# Patient Record
Sex: Male | Born: 1935 | Race: White | Hispanic: No | Smoking: Never smoker
Health system: Southern US, Community
[De-identification: ages and names within clinical notes are randomized; demographics above are authoritative.]

## PROBLEM LIST (undated history)

## (undated) DIAGNOSIS — F0390 Unspecified dementia without behavioral disturbance: Secondary | ICD-10-CM

## (undated) DIAGNOSIS — I4891 Unspecified atrial fibrillation: Secondary | ICD-10-CM

## (undated) DIAGNOSIS — I10 Essential (primary) hypertension: Secondary | ICD-10-CM

## (undated) DIAGNOSIS — K219 Gastro-esophageal reflux disease without esophagitis: Secondary | ICD-10-CM

## (undated) DIAGNOSIS — N189 Chronic kidney disease, unspecified: Secondary | ICD-10-CM

## (undated) DIAGNOSIS — E039 Hypothyroidism, unspecified: Secondary | ICD-10-CM

## (undated) DIAGNOSIS — M1990 Unspecified osteoarthritis, unspecified site: Secondary | ICD-10-CM

---

## 2015-03-07 ENCOUNTER — Inpatient Hospital Stay (HOSPITAL_COMMUNITY)
Admission: EM | Admit: 2015-03-07 | Discharge: 2015-03-13 | DRG: 177 | Disposition: A | Discharge status: Hospice | Payer: Medicare PPO | Attending: Internal Medicine | Admitting: Internal Medicine

## 2015-03-07 ENCOUNTER — Emergency Department (HOSPITAL_COMMUNITY): Payer: Medicare PPO

## 2015-03-07 ENCOUNTER — Encounter (HOSPITAL_COMMUNITY): Payer: Self-pay | Admitting: *Deleted

## 2015-03-07 DIAGNOSIS — N390 Urinary tract infection, site not specified: Secondary | ICD-10-CM | POA: Diagnosis present

## 2015-03-07 DIAGNOSIS — Z7401 Bed confinement status: Secondary | ICD-10-CM

## 2015-03-07 DIAGNOSIS — R112 Nausea with vomiting, unspecified: Secondary | ICD-10-CM | POA: Diagnosis present

## 2015-03-07 DIAGNOSIS — Z66 Do not resuscitate: Secondary | ICD-10-CM | POA: Diagnosis present

## 2015-03-07 DIAGNOSIS — G9340 Encephalopathy, unspecified: Secondary | ICD-10-CM | POA: Diagnosis present

## 2015-03-07 DIAGNOSIS — J9601 Acute respiratory failure with hypoxia: Secondary | ICD-10-CM | POA: Diagnosis present

## 2015-03-07 DIAGNOSIS — E43 Unspecified severe protein-calorie malnutrition: Secondary | ICD-10-CM | POA: Diagnosis present

## 2015-03-07 DIAGNOSIS — Z7982 Long term (current) use of aspirin: Secondary | ICD-10-CM

## 2015-03-07 DIAGNOSIS — R1314 Dysphagia, pharyngoesophageal phase: Secondary | ICD-10-CM | POA: Diagnosis present

## 2015-03-07 DIAGNOSIS — N183 Chronic kidney disease, stage 3 (moderate): Secondary | ICD-10-CM | POA: Diagnosis present

## 2015-03-07 DIAGNOSIS — E860 Dehydration: Secondary | ICD-10-CM | POA: Diagnosis present

## 2015-03-07 DIAGNOSIS — R627 Adult failure to thrive: Secondary | ICD-10-CM | POA: Diagnosis present

## 2015-03-07 DIAGNOSIS — I482 Chronic atrial fibrillation, unspecified: Secondary | ICD-10-CM | POA: Diagnosis present

## 2015-03-07 DIAGNOSIS — J690 Pneumonitis due to inhalation of food and vomit: Principal | ICD-10-CM | POA: Diagnosis present

## 2015-03-07 DIAGNOSIS — K219 Gastro-esophageal reflux disease without esophagitis: Secondary | ICD-10-CM | POA: Diagnosis present

## 2015-03-07 DIAGNOSIS — R1310 Dysphagia, unspecified: Secondary | ICD-10-CM | POA: Diagnosis present

## 2015-03-07 DIAGNOSIS — Z79891 Long term (current) use of opiate analgesic: Secondary | ICD-10-CM

## 2015-03-07 DIAGNOSIS — R52 Pain, unspecified: Secondary | ICD-10-CM

## 2015-03-07 DIAGNOSIS — R630 Anorexia: Secondary | ICD-10-CM | POA: Insufficient documentation

## 2015-03-07 DIAGNOSIS — E785 Hyperlipidemia, unspecified: Secondary | ICD-10-CM | POA: Diagnosis present

## 2015-03-07 DIAGNOSIS — R532 Functional quadriplegia: Secondary | ICD-10-CM | POA: Diagnosis present

## 2015-03-07 DIAGNOSIS — E039 Hypothyroidism, unspecified: Secondary | ICD-10-CM | POA: Diagnosis present

## 2015-03-07 DIAGNOSIS — I5032 Chronic diastolic (congestive) heart failure: Secondary | ICD-10-CM | POA: Diagnosis present

## 2015-03-07 DIAGNOSIS — R531 Weakness: Secondary | ICD-10-CM | POA: Insufficient documentation

## 2015-03-07 DIAGNOSIS — Z515 Encounter for palliative care: Secondary | ICD-10-CM

## 2015-03-07 DIAGNOSIS — M1990 Unspecified osteoarthritis, unspecified site: Secondary | ICD-10-CM | POA: Diagnosis present

## 2015-03-07 DIAGNOSIS — E870 Hyperosmolality and hypernatremia: Secondary | ICD-10-CM | POA: Diagnosis present

## 2015-03-07 DIAGNOSIS — I129 Hypertensive chronic kidney disease with stage 1 through stage 4 chronic kidney disease, or unspecified chronic kidney disease: Secondary | ICD-10-CM | POA: Diagnosis present

## 2015-03-07 DIAGNOSIS — N189 Chronic kidney disease, unspecified: Secondary | ICD-10-CM

## 2015-03-07 DIAGNOSIS — R1111 Vomiting without nausea: Secondary | ICD-10-CM

## 2015-03-07 DIAGNOSIS — E876 Hypokalemia: Secondary | ICD-10-CM | POA: Diagnosis present

## 2015-03-07 DIAGNOSIS — F0390 Unspecified dementia without behavioral disturbance: Secondary | ICD-10-CM | POA: Diagnosis present

## 2015-03-07 DIAGNOSIS — R1110 Vomiting, unspecified: Secondary | ICD-10-CM

## 2015-03-07 DIAGNOSIS — N179 Acute kidney failure, unspecified: Secondary | ICD-10-CM | POA: Diagnosis present

## 2015-03-07 DIAGNOSIS — Z79899 Other long term (current) drug therapy: Secondary | ICD-10-CM

## 2015-03-07 HISTORY — DX: Gastro-esophageal reflux disease without esophagitis: K21.9

## 2015-03-07 HISTORY — DX: Chronic kidney disease, unspecified: N18.9

## 2015-03-07 HISTORY — DX: Hypothyroidism, unspecified: E03.9

## 2015-03-07 HISTORY — DX: Essential (primary) hypertension: I10

## 2015-03-07 HISTORY — DX: Unspecified osteoarthritis, unspecified site: M19.90

## 2015-03-07 HISTORY — DX: Unspecified dementia, unspecified severity, without behavioral disturbance, psychotic disturbance, mood disturbance, and anxiety: F03.90

## 2015-03-07 HISTORY — DX: Unspecified atrial fibrillation: I48.91

## 2015-03-07 LAB — CBC WITH DIFFERENTIAL/PLATELET
BASOS PCT: 0 % (ref 0–1)
Basophils Absolute: 0 10*3/uL (ref 0.0–0.1)
EOS ABS: 0.2 10*3/uL (ref 0.0–0.7)
Eosinophils Relative: 2 % (ref 0–5)
HCT: 43.1 % (ref 39.0–52.0)
HEMOGLOBIN: 14.5 g/dL (ref 13.0–17.0)
Lymphocytes Relative: 19 % (ref 12–46)
Lymphs Abs: 1.9 10*3/uL (ref 0.7–4.0)
MCH: 29.4 pg (ref 26.0–34.0)
MCHC: 33.6 g/dL (ref 30.0–36.0)
MCV: 87.2 fL (ref 78.0–100.0)
MONOS PCT: 10 % (ref 3–12)
Monocytes Absolute: 0.9 10*3/uL (ref 0.1–1.0)
NEUTROS PCT: 69 % (ref 43–77)
Neutro Abs: 6.7 10*3/uL (ref 1.7–7.7)
PLATELETS: 265 10*3/uL (ref 150–400)
RBC: 4.94 MIL/uL (ref 4.22–5.81)
RDW: 14.8 % (ref 11.5–15.5)
WBC: 9.7 10*3/uL (ref 4.0–10.5)

## 2015-03-07 LAB — COMPREHENSIVE METABOLIC PANEL
ALBUMIN: 3.1 g/dL — AB (ref 3.5–5.2)
ALT: 9 U/L (ref 0–53)
ANION GAP: 15 (ref 5–15)
AST: 13 U/L (ref 0–37)
Alkaline Phosphatase: 103 U/L (ref 39–117)
BUN: 72 mg/dL — ABNORMAL HIGH (ref 6–23)
CO2: 21 mmol/L (ref 19–32)
CREATININE: 3.55 mg/dL — AB (ref 0.50–1.35)
Calcium: 8.7 mg/dL (ref 8.4–10.5)
Chloride: 109 mmol/L (ref 96–112)
GFR calc Af Amer: 16 mL/min — ABNORMAL LOW (ref >90)
GFR calc non Af Amer: 14 mL/min — ABNORMAL LOW (ref >90)
Glucose, Bld: 88 mg/dL (ref 70–99)
Potassium: 3.4 mmol/L — ABNORMAL LOW (ref 3.5–5.1)
Sodium: 145 mmol/L (ref 135–145)
TOTAL PROTEIN: 7.2 g/dL (ref 6.0–8.3)
Total Bilirubin: 0.7 mg/dL (ref 0.3–1.2)

## 2015-03-07 LAB — I-STAT CHEM 8, ED
BUN: 57 mg/dL — ABNORMAL HIGH (ref 6–23)
CREATININE: 3.4 mg/dL — AB (ref 0.50–1.35)
Calcium, Ion: 1.14 mmol/L (ref 1.13–1.30)
Chloride: 109 mmol/L (ref 96–112)
GLUCOSE: 88 mg/dL (ref 70–99)
HCT: 46 % (ref 39.0–52.0)
HEMOGLOBIN: 15.6 g/dL (ref 13.0–17.0)
Potassium: 3.3 mmol/L — ABNORMAL LOW (ref 3.5–5.1)
Sodium: 145 mmol/L (ref 135–145)
TCO2: 17 mmol/L (ref 0–100)

## 2015-03-07 LAB — URINALYSIS, ROUTINE W REFLEX MICROSCOPIC
Glucose, UA: NEGATIVE mg/dL
KETONES UR: NEGATIVE mg/dL
NITRITE: NEGATIVE
PH: 5 (ref 5.0–8.0)
PROTEIN: 100 mg/dL — AB
Specific Gravity, Urine: 1.014 (ref 1.005–1.030)
Urobilinogen, UA: 1 mg/dL (ref 0.0–1.0)

## 2015-03-07 LAB — URINE MICROSCOPIC-ADD ON

## 2015-03-07 LAB — LIPASE, BLOOD: LIPASE: 28 U/L (ref 11–59)

## 2015-03-07 MED ORDER — POTASSIUM CHLORIDE 10 MEQ/100ML IV SOLN
10.0000 meq | INTRAVENOUS | Status: DC
Start: 2015-03-08 — End: 2015-03-08
  Administered 2015-03-08: 10 meq via INTRAVENOUS
  Filled 2015-03-07 (×2): qty 100

## 2015-03-07 MED ORDER — ONDANSETRON 8 MG PO TBDP
8.0000 mg | ORAL_TABLET | Freq: Once | ORAL | Status: AC
  Administered 2015-03-07: 8 mg via ORAL
  Filled 2015-03-07: qty 1

## 2015-03-07 NOTE — ED Provider Notes (Addendum)
CSN: [id number]     Arrival date & time 03/07/15  1840 History   First MD Initiated Contact with Patient 03/07/15 2026     Chief Complaint  Patient presents with  . Weakness  . Diarrhea  . Emesis     (Consider location/radiation/quality/duration/timing/severity/associated sxs/prior Treatment) HPI   John Wilkins is a 81 y.o. male who presents for evaluation of decreased oral intake, vomiting, and right-sided abdominal pain present for at least 1 week. The problem is also been present previously. Intermittently, and he was recently admitted to hospital, had evaluation, treatment, for it. He's been home from the hospital for 2 weeks. Patient gets 24/7 caretaking, and intermittent nursing evaluation, at his home. He is in his home alone otherwise. His [place] in [place].  Level V Caveat- Dementia   Past Medical History  Diagnosis Date  . Hypertension   . GERD (gastroesophageal reflux disease)   . Osteoarthritis   . Chronic kidney disease   . Dementia   . Hypothyroidism   . A-fib    No past surgical history on file. No family history on file. History  Substance Use Topics  . Smoking status: Not on file  . Smokeless tobacco: Not on file  . Alcohol Use: Not on file    Review of Systems  Unable to perform ROS     Allergies  Review of patient's allergies indicates no known allergies.  Home Medications   Prior to Admission medications   Medication Sig Start Date End Date Taking? Authorizing Provider  acetaminophen (TYLENOL) 500 MG tablet Take 500 mg by mouth every 6 (six) hours as needed for mild pain.   Yes Historical Provider, MD  aspirin EC 81 MG tablet Take 81 mg by mouth daily.   Yes Historical Provider, MD  diltiazem (DILACOR XR) 180 MG 24 hr capsule Take 180 mg by mouth daily.   Yes Historical Provider, MD  donepezil (ARICEPT) 10 MG tablet Take 10 mg by mouth at bedtime.   Yes Historical Provider, MD  furosemide (LASIX) 20 MG tablet Take 20  mg by mouth 2 (two) times daily.   Yes Historical Provider, MD  levothyroxine (SYNTHROID, LEVOTHROID) 112 MCG tablet Take 112 mcg by mouth daily before breakfast.   Yes Historical Provider, MD  megestrol (MEGACE) 40 MG tablet Take 40 mg by mouth 4 (four) times daily.   Yes Historical Provider, MD  metoprolol tartrate (LOPRESSOR) 25 MG tablet Take 25 mg by mouth 2 (two) times daily.   Yes Historical Provider, MD  pantoprazole (PROTONIX) 40 MG tablet Take 40 mg by mouth daily.   Yes Historical Provider, MD  pravastatin (PRAVACHOL) 20 MG tablet Take 20 mg by mouth daily.   Yes Historical Provider, MD  traMADol (ULTRAM) 50 MG tablet Take 50 mg by mouth every 6 (six) hours as needed for moderate pain.   Yes Historical Provider, MD   BP 155/97 mmHg  Pulse 72  Temp(Src) 98.6 F (37 C) (Rectal)  Resp 16  SpO2 92% Physical Exam  Constitutional: He appears well-developed.  Elderly, frail  HENT:  Head: Normocephalic and atraumatic.  Right Ear: External ear normal.  Left Ear: External ear normal.  Mucous members are moist.  Eyes: Conjunctivae and EOM are normal. Pupils are equal, round, and reactive to light.  Neck: Normal range of motion and phonation normal. Neck supple.  Cardiovascular: Normal rate, regular rhythm and normal heart sounds.   Pulmonary/Chest: Effort normal and breath sounds normal. No respiratory distress. He  exhibits no bony tenderness.  Abdominal: Soft. He exhibits no distension and no mass. There is tenderness (Right-sided, diffuse, mild.). There is no rebound and no guarding.  Abdomen is soft.  Musculoskeletal: Normal range of motion.  Neurological: He is alert. No cranial nerve deficit or sensory deficit. He exhibits normal muscle tone. Coordination normal.  Skin: Skin is warm, dry and intact.  Psychiatric: He has a normal mood and affect. His behavior is normal.  Nursing note and vitals reviewed.   ED Course  Procedures (including critical care time)  Medications   potassium chloride 10 mEq in 100 mL IVPB (not administered)  sodium chloride 0.9 % bolus 2,000 mL (not administered)  ondansetron (ZOFRAN-ODT) disintegrating tablet 8 mg (8 mg Oral Given 03/07/15 2121)    Patient Vitals for the past 24 hrs:  BP Temp Temp src Pulse Resp SpO2  03/07/15 2203 155/97 mmHg - - 72 16 92 %  03/07/15 1857 131/72 mmHg 98.6 F (37 C) Rectal 64 16 95 %    12:00 AM-Consult complete with Hospitalist. Patient case explained and discussed. He agrees to admit patient for further evaluation and treatment. Call ended at 0005    Labs Review Labs Reviewed  COMPREHENSIVE METABOLIC PANEL - Abnormal; Notable for the following:    Potassium 3.4 (*)    BUN 72 (*)    Creatinine, Ser 3.55 (*)    Albumin 3.1 (*)    GFR calc non Af Amer 14 (*)    GFR calc Af Amer 16 (*)    All other components within normal limits  URINALYSIS, ROUTINE W REFLEX MICROSCOPIC - Abnormal; Notable for the following:    Hgb urine dipstick TRACE (*)    Bilirubin Urine SMALL (*)    Protein, ur 100 (*)    Leukocytes, UA TRACE (*)    All other components within normal limits  I-STAT CHEM 8, ED - Abnormal; Notable for the following:    Potassium 3.3 (*)    BUN 57 (*)    Creatinine, Ser 3.40 (*)    All other components within normal limits  URINE CULTURE  LIPASE, BLOOD  CBC WITH DIFFERENTIAL/PLATELET  URINE MICROSCOPIC-ADD ON  BLOOD GAS, ARTERIAL   _________________________________________________________________________________  Below labs, are from recent hospitalization at [facility]- Copied from Care Everywhere  Labs: Results for orders placed or performed during the hospital encounter of 02/17/15 (from the past 24 hour(s))  Basic metabolic panel  Collection Time: 02/19/15 3:52 AM  Result Value Ref Range  Sodium 144 136 - 146 mmol/L  Potassium 3.8 3.7 - 5.4 mmol/L  Chloride 106 97 - 108 mmol/L  Carbon Dioxide (CO2) 23 20 - 32 mmol/L  Anion Gap 15 7 - 16 mmol/L  Glucose 87 65 - 99  mg/dL  BUN 26 10 - 36 mg/dL  Creatinine 1.61 (H) 0.96 - 1.27 mg/dL  Calcium 8.7 8.6 - 04.5 mg/dL  BUN/Creatinine Ratio 40.9 11.0 - 26.0  GFR-African American 40 mL/min/1.7m2  GFR Non-African American 33 mL/min/1.42m2  ZCBC  Collection Time: 02/19/15 3:52 AM  Result Value Ref Range  WBC 6.7 5.1 - 10.8 thou/mcL  RBC 4.29 4.05 - 5.64 million/mcL  Hemoglobin 12.3 (L) 13.5 - 17.5 gm/dL  Hematocrit 81.1 (L) 91.4 - 52.5 %  MCV 88 83 - 97 fL  MCH 28.7 28.0 - 33.0 pg  MCHC 32.5 32.0 - 36.0 gm/dL  Platelet Count 782 956 - 400 thou/mcL  RDW Standard Deviation 44.6 38.5 - 50.5 fL  RDW Coefficient of Variation  14.1 11.7 - 15.2 %  MPV 9.6 8.9 - 11.0 fL  Automated Differential  Collection Time: 02/19/15 3:52 AM  Result Value Ref Range  Neutrophils % 58.0 50.0 - 70.0 %  Lymphocytes, % 25.7 25.0 - 40.0 %  Monocytes % 13.4 (H) 4.0 - 12.0 %  Eosinophils % 2.7 1.0 - 6.0 %  Basophils % 0.2 0.0 - 2.0 %  Neutrophils Absolute 3.9 1.5 - 7.5 thou/mcL  Lymphocytes Absolute 1.7 1.0 - 4.5 thou/mcL  Monocytes Absolute 0.9 (H) 0.1 - 0.8 thou/mcL  Eos (Absolute Value) 0.2 0.0 - 0.5 thou/mcL  Basophils Absolute 0.0 0.0 - 0.2 thou/mcL  _________________________________________________________________________________    Imaging Review Ct Abdomen Pelvis Wo Contrast  03/07/2015   CLINICAL DATA:  Abdominal pain of 6 weeks duration.  EXAM: CT ABDOMEN AND PELVIS WITHOUT CONTRAST  TECHNIQUE: Multidetector CT imaging of the abdomen and pelvis was performed following the standard protocol without IV contrast.  COMPARISON:  None.  FINDINGS: There are unremarkable unenhanced appearances of the liver and bile ducts. There is prior cholecystectomy.  The spleen, pancreas and adrenals appear unremarkable.  Multiple renal cysts are present bilaterally, measuring up to 9 cm on the right and 8 cm on the left. These are not conclusively characterized in the absence of intravenous contrast but are more likely benign. Both kidneys  are negative for calculus or hydronephrosis. Both ureters appear unremarkable. Urinary bladder is decompressed but grossly unremarkable.  The abdominal aorta is normal in caliber with mild atherosclerotic calcification.  There are unremarkable appearances of the stomach, small bowel and colon.  No acute inflammatory changes are evident in the abdomen or pelvis. There is no ascites. There is no adenopathy.  Mild ground-glass opacities are present in the bases of both lungs, indeterminate with regard to infectious infiltrate versus atelectasis versus aspiration. There is a fat containing diaphragmatic hernia on the right posteriorly.  Multiple lower thoracic and lumbar vertebral compressions are present, more likely old. No discrete skeletal lesions are evident.  IMPRESSION: 1. No acute findings are evident in the abdomen or pelvis. 2. Multiple renal cysts bilaterally, indeterminate but more likely benign. 3. Mild airspace opacities in both lung bases, indeterminate with regard to infection versus atelectasis versus aspiration.   Electronically Signed   By: Ellery Plunkaniel R Mitchell M.D.   On: 03/07/2015 23:11   Dg Chest 1 View  03/07/2015   CLINICAL DATA:  Weakness.  Diarrhea.  EXAM: CHEST  1 VIEW  COMPARISON:  None.  FINDINGS: The Chin overlies the apices. High riding humeral heads, consistent with chronic rotator cuff insufficiency. Mild cardiomegaly with a tortuous thoracic aorta. No right and no definite left pleural effusion. No pneumothorax. Mild right base scarring. Patchy left mid and lower lung airspace disease.  IMPRESSION: Cardiomegaly without congestive failure.  Patchy left-sided opacities which are of indeterminate acuity. Cannot exclude infection or aspiration. Consider PA and lateral radiographs if possible.   Electronically Signed   By: Jeronimo GreavesKyle  Talbot M.D.   On: 03/07/2015 21:50     EKG Interpretation None      MDM   Final diagnoses:  Vomiting without nausea, vomiting of unspecified type  AKI  (acute kidney injury)  Hypokalemia  Dementia, without behavioral disturbance    Decreased oral intake, with vomiting, and fluid intolerance, now with acute kidney injury, rising creatinine from baseline 2 weeks ago, with elevated BUN. No acute intra-abdominal pathology. Potassium is somewhat low, not elevated. Patient will require overnight observation for IV fluids, and attempted sacralization before  consideration of discharge. His PCP recently started him on Megace, to improve his appetite.   Nursing Notes Reviewed/ Care Coordinated, and agree without changes. Applicable Imaging Reviewed.  Interpretation of Laboratory Data incorporated into ED treatment   Plan: Admit    Mancel Bale, MD 03/08/15 0006  Mancel Bale, MD 03/08/15 4540

## 2015-03-07 NOTE — ED Notes (Signed)
Per EMS, Pt from home where he lives with family, hx of dementia. Per family pt is dehydrated, decreased intake over the last three days. Family also sts that pt is lethargic. Pt normally walks with assistance but has had difficulty doing so the past three days. Pt had one episode of vomiting and one episode of diarrhea today.

## 2015-03-08 ENCOUNTER — Encounter (HOSPITAL_COMMUNITY): Payer: Self-pay | Admitting: Internal Medicine

## 2015-03-08 DIAGNOSIS — G9340 Encephalopathy, unspecified: Secondary | ICD-10-CM | POA: Diagnosis present

## 2015-03-08 DIAGNOSIS — I5032 Chronic diastolic (congestive) heart failure: Secondary | ICD-10-CM | POA: Diagnosis present

## 2015-03-08 DIAGNOSIS — F0390 Unspecified dementia without behavioral disturbance: Secondary | ICD-10-CM

## 2015-03-08 DIAGNOSIS — R112 Nausea with vomiting, unspecified: Secondary | ICD-10-CM | POA: Diagnosis present

## 2015-03-08 DIAGNOSIS — Z79891 Long term (current) use of opiate analgesic: Secondary | ICD-10-CM | POA: Diagnosis not present

## 2015-03-08 DIAGNOSIS — E870 Hyperosmolality and hypernatremia: Secondary | ICD-10-CM | POA: Diagnosis present

## 2015-03-08 DIAGNOSIS — N179 Acute kidney failure, unspecified: Secondary | ICD-10-CM | POA: Diagnosis present

## 2015-03-08 DIAGNOSIS — N183 Chronic kidney disease, stage 3 (moderate): Secondary | ICD-10-CM | POA: Diagnosis present

## 2015-03-08 DIAGNOSIS — J690 Pneumonitis due to inhalation of food and vomit: Secondary | ICD-10-CM | POA: Diagnosis present

## 2015-03-08 DIAGNOSIS — Z66 Do not resuscitate: Secondary | ICD-10-CM | POA: Diagnosis present

## 2015-03-08 DIAGNOSIS — E039 Hypothyroidism, unspecified: Secondary | ICD-10-CM | POA: Diagnosis present

## 2015-03-08 DIAGNOSIS — K219 Gastro-esophageal reflux disease without esophagitis: Secondary | ICD-10-CM | POA: Diagnosis present

## 2015-03-08 DIAGNOSIS — Z7982 Long term (current) use of aspirin: Secondary | ICD-10-CM | POA: Diagnosis not present

## 2015-03-08 DIAGNOSIS — R532 Functional quadriplegia: Secondary | ICD-10-CM | POA: Diagnosis present

## 2015-03-08 DIAGNOSIS — R627 Adult failure to thrive: Secondary | ICD-10-CM | POA: Diagnosis present

## 2015-03-08 DIAGNOSIS — M1990 Unspecified osteoarthritis, unspecified site: Secondary | ICD-10-CM | POA: Diagnosis present

## 2015-03-08 DIAGNOSIS — Z7401 Bed confinement status: Secondary | ICD-10-CM | POA: Diagnosis not present

## 2015-03-08 DIAGNOSIS — E43 Unspecified severe protein-calorie malnutrition: Secondary | ICD-10-CM | POA: Diagnosis present

## 2015-03-08 DIAGNOSIS — R1310 Dysphagia, unspecified: Secondary | ICD-10-CM | POA: Diagnosis present

## 2015-03-08 DIAGNOSIS — I482 Chronic atrial fibrillation, unspecified: Secondary | ICD-10-CM | POA: Diagnosis present

## 2015-03-08 DIAGNOSIS — J9601 Acute respiratory failure with hypoxia: Secondary | ICD-10-CM | POA: Diagnosis present

## 2015-03-08 DIAGNOSIS — N390 Urinary tract infection, site not specified: Secondary | ICD-10-CM | POA: Diagnosis present

## 2015-03-08 DIAGNOSIS — E876 Hypokalemia: Secondary | ICD-10-CM | POA: Diagnosis present

## 2015-03-08 DIAGNOSIS — N189 Chronic kidney disease, unspecified: Secondary | ICD-10-CM

## 2015-03-08 DIAGNOSIS — Z515 Encounter for palliative care: Secondary | ICD-10-CM | POA: Diagnosis not present

## 2015-03-08 DIAGNOSIS — R1314 Dysphagia, pharyngoesophageal phase: Secondary | ICD-10-CM | POA: Diagnosis present

## 2015-03-08 DIAGNOSIS — Z79899 Other long term (current) drug therapy: Secondary | ICD-10-CM | POA: Diagnosis not present

## 2015-03-08 DIAGNOSIS — E860 Dehydration: Secondary | ICD-10-CM | POA: Diagnosis present

## 2015-03-08 DIAGNOSIS — I129 Hypertensive chronic kidney disease with stage 1 through stage 4 chronic kidney disease, or unspecified chronic kidney disease: Secondary | ICD-10-CM | POA: Diagnosis present

## 2015-03-08 DIAGNOSIS — E785 Hyperlipidemia, unspecified: Secondary | ICD-10-CM | POA: Diagnosis present

## 2015-03-08 LAB — COMPREHENSIVE METABOLIC PANEL
ALK PHOS: 97 U/L (ref 39–117)
ALT: 9 U/L (ref 0–53)
AST: 14 U/L (ref 0–37)
Albumin: 2.9 g/dL — ABNORMAL LOW (ref 3.5–5.2)
Anion gap: 21 — ABNORMAL HIGH (ref 5–15)
BUN: 67 mg/dL — ABNORMAL HIGH (ref 6–23)
CALCIUM: 8.4 mg/dL (ref 8.4–10.5)
CO2: 19 mmol/L (ref 19–32)
Chloride: 104 mmol/L (ref 96–112)
Creatinine, Ser: 3.49 mg/dL — ABNORMAL HIGH (ref 0.50–1.35)
GFR calc Af Amer: 16 mL/min — ABNORMAL LOW (ref >90)
GFR calc non Af Amer: 14 mL/min — ABNORMAL LOW (ref >90)
GLUCOSE: 67 mg/dL — AB (ref 70–99)
Potassium: 3.6 mmol/L (ref 3.5–5.1)
Sodium: 144 mmol/L (ref 135–145)
Total Bilirubin: 0.9 mg/dL (ref 0.3–1.2)
Total Protein: 6.7 g/dL (ref 6.0–8.3)

## 2015-03-08 LAB — CBC WITH DIFFERENTIAL/PLATELET
Basophils Absolute: 0 10*3/uL (ref 0.0–0.1)
Basophils Relative: 0 % (ref 0–1)
EOS PCT: 2 % (ref 0–5)
Eosinophils Absolute: 0.2 10*3/uL (ref 0.0–0.7)
HCT: 43.2 % (ref 39.0–52.0)
HEMOGLOBIN: 14.5 g/dL (ref 13.0–17.0)
LYMPHS ABS: 1.6 10*3/uL (ref 0.7–4.0)
Lymphocytes Relative: 19 % (ref 12–46)
MCH: 29.2 pg (ref 26.0–34.0)
MCHC: 33.6 g/dL (ref 30.0–36.0)
MCV: 87.1 fL (ref 78.0–100.0)
Monocytes Absolute: 1 10*3/uL (ref 0.1–1.0)
Monocytes Relative: 12 % (ref 3–12)
Neutro Abs: 5.8 10*3/uL (ref 1.7–7.7)
Neutrophils Relative %: 68 % (ref 43–77)
Platelets: 243 10*3/uL (ref 150–400)
RBC: 4.96 MIL/uL (ref 4.22–5.81)
RDW: 14.8 % (ref 11.5–15.5)
WBC: 8.6 10*3/uL (ref 4.0–10.5)

## 2015-03-08 MED ORDER — PRAVASTATIN SODIUM 20 MG PO TABS
20.0000 mg | ORAL_TABLET | Freq: Every day | ORAL | Status: DC
  Filled 2015-03-08: qty 1

## 2015-03-08 MED ORDER — SODIUM CHLORIDE 0.9 % IV BOLUS (SEPSIS)
2000.0000 mL | Freq: Once | INTRAVENOUS | Status: AC
  Administered 2015-03-08: 2000 mL via INTRAVENOUS

## 2015-03-08 MED ORDER — DONEPEZIL HCL 10 MG PO TABS: 10.0000 mg | ORAL_TABLET | Freq: Every day | ORAL | Status: DC

## 2015-03-08 MED ORDER — SODIUM CHLORIDE 0.9 % IV SOLN
INTRAVENOUS | Status: DC
  Administered 2015-03-08: 100 mL/h via INTRAVENOUS

## 2015-03-08 MED ORDER — METOPROLOL TARTRATE 25 MG PO TABS
25.0000 mg | ORAL_TABLET | Freq: Two times a day (BID) | ORAL | Status: DC
  Administered 2015-03-08 – 2015-03-09 (×2): 25 mg via ORAL
  Filled 2015-03-08 (×5): qty 1

## 2015-03-08 MED ORDER — LEVOTHYROXINE SODIUM 112 MCG PO TABS
112.0000 ug | ORAL_TABLET | Freq: Every day | ORAL | Status: DC
  Filled 2015-03-08: qty 1

## 2015-03-08 MED ORDER — SODIUM CHLORIDE 0.9 % IV SOLN: INTRAVENOUS | Status: DC

## 2015-03-08 MED ORDER — ASPIRIN EC 81 MG PO TBEC
81.0000 mg | DELAYED_RELEASE_TABLET | Freq: Every day | ORAL | Status: DC
Start: 2015-03-08 — End: 2015-03-10
  Filled 2015-03-08 (×3): qty 1

## 2015-03-08 MED ORDER — LEVOTHYROXINE SODIUM 100 MCG IV SOLR
66.0000 ug | Freq: Every day | INTRAVENOUS | Status: DC
  Administered 2015-03-08 – 2015-03-13 (×6): 66 ug via INTRAVENOUS
  Filled 2015-03-08 (×6): qty 5

## 2015-03-08 MED ORDER — PANTOPRAZOLE SODIUM 40 MG PO TBEC
40.0000 mg | DELAYED_RELEASE_TABLET | Freq: Every day | ORAL | Status: DC
  Filled 2015-03-08: qty 1

## 2015-03-08 MED ORDER — PIPERACILLIN-TAZOBACTAM IN DEX 2-0.25 GM/50ML IV SOLN
2.2500 g | Freq: Four times a day (QID) | INTRAVENOUS | Status: DC
  Administered 2015-03-08 – 2015-03-13 (×20): 2.25 g via INTRAVENOUS
  Filled 2015-03-08 (×22): qty 50

## 2015-03-08 MED ORDER — ACETAMINOPHEN 650 MG RE SUPP: 650.0000 mg | Freq: Four times a day (QID) | RECTAL | Status: DC | PRN

## 2015-03-08 MED ORDER — ACETAMINOPHEN 325 MG PO TABS
650.0000 mg | ORAL_TABLET | Freq: Four times a day (QID) | ORAL | Status: DC | PRN
  Administered 2015-03-09: 650 mg via ORAL
  Filled 2015-03-08: qty 2

## 2015-03-08 MED ORDER — CEFTRIAXONE SODIUM IN DEXTROSE 40 MG/ML IV SOLN
2.0000 g | Freq: Every day | INTRAVENOUS | Status: DC
  Administered 2015-03-08: 2 g via INTRAVENOUS
  Filled 2015-03-08: qty 50

## 2015-03-08 MED ORDER — DILTIAZEM HCL ER 180 MG PO CP24
180.0000 mg | ORAL_CAPSULE | Freq: Every day | ORAL | Status: DC
  Filled 2015-03-08 (×4): qty 1

## 2015-03-08 MED ORDER — ENOXAPARIN SODIUM 40 MG/0.4ML ~~LOC~~ SOLN
40.0000 mg | SUBCUTANEOUS | Status: DC
  Administered 2015-03-08: 40 mg via SUBCUTANEOUS
  Filled 2015-03-08: qty 0.4

## 2015-03-08 MED ORDER — MEGESTROL ACETATE 40 MG PO TABS
40.0000 mg | ORAL_TABLET | Freq: Four times a day (QID) | ORAL | Status: DC
Start: 2015-03-08 — End: 2015-03-10
  Administered 2015-03-08 (×2): 40 mg via ORAL
  Filled 2015-03-08 (×11): qty 1

## 2015-03-08 MED ORDER — TRAMADOL HCL 50 MG PO TABS: 50.0000 mg | ORAL_TABLET | Freq: Four times a day (QID) | ORAL | Status: DC | PRN

## 2015-03-08 MED ORDER — ENOXAPARIN SODIUM 30 MG/0.3ML ~~LOC~~ SOLN
30.0000 mg | SUBCUTANEOUS | Status: DC
  Administered 2015-03-09: 30 mg via SUBCUTANEOUS
  Filled 2015-03-08: qty 0.3

## 2015-03-08 MED ORDER — ONDANSETRON HCL 4 MG PO TABS: 4.0000 mg | ORAL_TABLET | Freq: Four times a day (QID) | ORAL | Status: DC | PRN

## 2015-03-08 MED ORDER — PANTOPRAZOLE SODIUM 40 MG IV SOLR
40.0000 mg | Freq: Every day | INTRAVENOUS | Status: DC
  Administered 2015-03-08 – 2015-03-13 (×6): 40 mg via INTRAVENOUS
  Filled 2015-03-08 (×6): qty 40

## 2015-03-08 MED ORDER — ONDANSETRON HCL 4 MG/2ML IJ SOLN
4.0000 mg | Freq: Four times a day (QID) | INTRAMUSCULAR | Status: DC | PRN
  Administered 2015-03-11 – 2015-03-12 (×2): 4 mg via INTRAVENOUS
  Filled 2015-03-08 (×2): qty 2

## 2015-03-08 NOTE — ED Notes (Signed)
CT Abdomin Pelvis Wo Contrast documented in error.

## 2015-03-08 NOTE — Progress Notes (Signed)
ANTIBIOTIC CONSULT NOTE - INITIAL  Pharmacy Consult for Zosyn Indication: UTI, suspected aspiration pneumonia  No Known Allergies  Patient Measurements: Height: 5\' 9"  (175.3 cm) Weight: 159 lb 3.2 oz (72.213 kg) IBW/kg (Calculated) : 70.7 Adjusted Body Weight:   Vital Signs: Temp: 97.6 F (36.4 C) (04/02 0518) Temp Source: Oral (04/02 0518) BP: 150/72 mmHg (04/02 1007) Pulse Rate: 78 (04/02 1007) Intake/Output from previous day: 04/01 0701 - 04/02 0700 In: -  Out: 300 [Urine:300] Intake/Output from this shift:    Labs:  Recent Labs  03/07/15 2122 03/07/15 2124 03/08/15 0400  WBC 9.7  --  8.6  HGB 14.5 15.6 14.5  PLT 265  --  243  CREATININE 3.55* 3.40* 3.49*   Estimated Creatinine Clearance: 13.8 mL/min (by C-G formula based on Cr of 3.49). No results for input(s): VANCOTROUGH, VANCOPEAK, VANCORANDOM, GENTTROUGH, GENTPEAK, GENTRANDOM, TOBRATROUGH, TOBRAPEAK, TOBRARND, AMIKACINPEAK, AMIKACINTROU, AMIKACIN in the last 72 hours.   Microbiology: No results found for this or any previous visit (from the past 720 hour(s)).  Medical History: Past Medical History  Diagnosis Date  . Hypertension   . GERD (gastroesophageal reflux disease)   . Osteoarthritis   . Chronic kidney disease   . Dementia   . Hypothyroidism   . A-fib     Medications:  Anti-infectives    Start     Dose/Rate Route Frequency Ordered Stop   03/08/15 0430  cefTRIAXone (ROCEPHIN) 2 g in dextrose 5 % 50 mL IVPB - Premix  Status:  Discontinued     2 g 100 mL/hr over 30 Minutes Intravenous Daily 03/08/15 0419 03/08/15 1232     Assessment: HPI: 81 y.o. male with known history of dementia, chronic atrial fibrillation, chronic diastolic CHF, hypothyroidism, chronic kidney disease was brought to the ER after patient had persistent nausea and vomiting. In the ER patient's creatinine is found to be elevated and baseline done 2 weeks ago was around 1.7. Now with suspected aspiration pneumonia.  4/2 >>  ceftriaxone >> 4/2 4/2 >> Zosyn>>  Tmax: 97.7 WBCs: 9.7 Renal: SCr 3.49(baseline SCr 1.7), CrCl ~3414ml/min.  4/1 urine: sent  Goal of Therapy:  Appropriate antibiotic dosing for renal function; eradication of infection.  Plan: Zosyn 2.25g IV q6h. Follow up renal fxn, culture results, and clinical course.  Charolotte Ekeom Chanay Nugent, PharmD, pager [phone]. 03/08/2015,12:39 PM.

## 2015-03-08 NOTE — Evaluation (Signed)
Clinical/Bedside Swallow Evaluation Patient Details  Name: John Wilkins MRN: [id number] Date of Birth: 02-26-1922  Today's Date: 03/08/2015 Time: SLP Start Time (ACUTE ONLY): 1535 SLP Stop Time (ACUTE ONLY): 1606 SLP Time Calculation (min) (ACUTE ONLY): 31 min  Past Medical History:  Past Medical History  Diagnosis Date  . Hypertension   . GERD (gastroesophageal reflux disease)   . Osteoarthritis   . Chronic kidney disease   . Dementia   . Hypothyroidism   . A-fib    Past Surgical History: History reviewed. No pertinent past surgical history. HPI:  81 y.o. male with known history of dementia, chronic atrial fibrillation, chronic diastolic CHF, hypothyroidism, chronic kidney disease was brought to the ER after patient had persistent nausea and vomiting. Patient has been having these symptoms ongoing for last 2-3 weeks and was also [facility].   Swallow eval ordered, Rn reports pt orally holding medications.   Ct Abdomen Pelvis Wo Contrast  03/07/2015   CLINICAL DATA:  Abdominal pain of 6 weeks duration.  EXAM: CT ABDOMEN AND PELVIS WITHOUT CONTRAST  TECHNIQUE: Multidetector CT imaging of the abdomen and pelvis was performed following the standard protocol without IV contrast.  COMPARISON:  None.  FINDINGS: There are unremarkable unenhanced appearances of the liver and bile ducts. There is prior cholecystectomy.  The spleen, pancreas and adrenals appear unremarkable.  Multiple renal cysts are present bilaterally, measuring up to 9 cm on the right and 8 cm on the left. These are not conclusively characterized in the absence of intravenous contrast but are more likely benign. Both kidneys are negative for calculus or hydronephrosis. Both ureters appear unremarkable. Urinary bladder is decompressed but grossly unremarkable.  The abdominal aorta is normal in caliber with mild atherosclerotic calcification.  There are unremarkable appearances of the stomach, small bowel and  colon.  No acute inflammatory changes are evident in the abdomen or pelvis. There is no ascites. There is no adenopathy.  Mild ground-glass opacities are present in the bases of both lungs, indeterminate with regard to infectious infiltrate versus atelectasis versus aspiration. There is a fat containing diaphragmatic hernia on the right posteriorly.  Multiple lower thoracic and lumbar vertebral compressions are present, more likely old. No discrete skeletal lesions are evident.  IMPRESSION: 1. No acute findings are evident in the abdomen or pelvis. 2. Multiple renal cysts bilaterally, indeterminate but more likely benign. 3. Mild airspace opacities in both lung bases, indeterminate with regard to infection versus atelectasis versus aspiration.   Electronically Signed   By: Ellery Plunkaniel R Mitchell M.D.   On: 03/07/2015 23:11   Dg Chest 1 View  03/07/2015   CLINICAL DATA:  Weakness.  Diarrhea.  EXAM: CHEST  1 VIEW  COMPARISON:  None.  FINDINGS: The Chin overlies the apices. High riding humeral heads, consistent with chronic rotator cuff insufficiency. Mild cardiomegaly with a tortuous thoracic aorta. No right and no definite left pleural effusion. No pneumothorax. Mild right base scarring. Patchy left mid and lower lung airspace disease.  IMPRESSION: Cardiomegaly without congestive failure.  Patchy left-sided opacities which are of indeterminate acuity. Cannot exclude infection or aspiration. Consider PA and lateral radiographs if possible.   Electronically Signed   By: Jeronimo GreavesKyle  Talbot M.D.   On: 03/07/2015 21:50     Assessment / Plan / Recommendation Clinical Impression  Positioning is large barrier for proper intake for this pt.  No overt CN deficits but general weakness. Pt given thin liquid and jello via tsp, straw.  Decreased labial  seal noted with delayed oral transiting but no s/s of airway compromise.  John Wilkins, caregive of pt x3 years present and reports pt has been receiving pureed foods at home.  He has  recently been holding food in his mouth and not clearing.  Advised her to assure pt swallows before giving more and only provide intake when he desires.  Explained dysphagia coorelated with dementia that is exacerbated with current medical issue/positioning.  In addition, pt aspiraiton risk is present from his n/v.  His efficiency of swallow is better with liquids - recommend to continue liquids for now and SLP to follow up for advancement readiness.  MD may wish to consider GI referral given n/v issue x4 weeks per caregiver.  Using teach back, education conducted.  Will follow.    Aspiration Risk  Moderate    Diet Recommendation Thin liquid   Liquid Administration via: Spoon (no straw per family stating [place] MD advised against) Medication Administration: Via alternative means Supervision: Full supervision/cueing for compensatory strategies;Trained caregiver to feed patient Compensations: Slow rate;Small sips/bites Postural Changes and/or Swallow Maneuvers: Seated upright 90 degrees;Upright 30-60 min after meal    Other  Recommendations Oral Care Recommendations: Oral care BID   Follow Up Recommendations       Frequency and Duration min 1 x/week  1 week   Pertinent Vitals/Pain Afebrile, decreased     Swallow Study Prior Functional Status   see hhx    General Date of Onset: 02/05/15 HPI: 81 y.o. male with known history of dementia, chronic atrial fibrillation, chronic diastolic CHF, hypothyroidism, chronic kidney disease was brought to the ER after patient had persistent nausea and vomiting. Patient has been having these symptoms ongoing for last 2-3 weeks and was also recently admitted at [facility].   Swallow eval ordered, Rn reports pt orally holding medications.  Type of Study: Bedside swallow evaluation Diet Prior to this Study: Thin liquids;Nectar-thick liquids Temperature Spikes Noted: No Respiratory Status: Room air History of Recent Intubation:  No Behavior/Cognition: Alert;Confused;Requires cueing;Doesn't follow directions Oral Cavity - Dentition:  (poor condition) Self-Feeding Abilities: Total assist Patient Positioning: Postural control interferes with function (pt positioned on side, did not straighten legs even with assist) Baseline Vocal Quality: Clear Volitional Cough: Cognitively unable to elicit Volitional Swallow: Unable to elicit    Oral/Motor/Sensory Function Overall Oral Motor/Sensory Function: Appears within functional limits for tasks assessed (generalized weakness)   Ice Chips Ice chips: Not tested   Thin Liquid Thin Liquid: Impaired Presentation: Spoon;Straw Oral Phase Impairments: Reduced lingual movement/coordination;Impaired anterior to posterior transit;Reduced labial seal Oral Phase Functional Implications: Prolonged oral transit    Nectar Thick Nectar Thick Liquid: Not tested   Honey Thick Honey Thick Liquid: Not tested   Puree Puree: Impaired Presentation: Spoon Oral Phase Impairments: Reduced labial seal;Reduced lingual movement/coordination;Impaired anterior to posterior transit Oral Phase Functional Implications: Prolonged oral transit   Solid   GO    Solid: Not tested       Mills Koller, MS [facility] SLP [phone]

## 2015-03-08 NOTE — Progress Notes (Signed)
ANTIBIOTIC CONSULT NOTE - INITIAL  Pharmacy Consult for Ceftriaxone Indication: UTI  No Known Allergies  Patient Measurements: Weight: 159 lb 3.2 oz (72.213 kg) Adjusted Body Weight:   Vital Signs: Temp: 97.7 F (36.5 C) (04/02 0200) Temp Source: Oral (04/02 0200) BP: 139/83 mmHg (04/02 0200) Pulse Rate: 106 (04/02 0200) Intake/Output from previous day:   Intake/Output from this shift:    Labs:  Recent Labs  03/07/15 2122 03/07/15 2124  WBC 9.7  --   HGB 14.5 15.6  PLT 265  --   CREATININE 3.55* 3.40*   CrCl cannot be calculated (Unknown ideal weight.). No results for input(s): VANCOTROUGH, VANCOPEAK, VANCORANDOM, GENTTROUGH, GENTPEAK, GENTRANDOM, TOBRATROUGH, TOBRAPEAK, TOBRARND, AMIKACINPEAK, AMIKACINTROU, AMIKACIN in the last 72 hours.   Microbiology: No results found for this or any previous visit (from the past 720 hour(s)).  Medical History: Past Medical History  Diagnosis Date  . Hypertension   . GERD (gastroesophageal reflux disease)   . Osteoarthritis   . Chronic kidney disease   . Dementia   . Hypothyroidism   . A-fib     Medications:  Anti-infectives    Start     Dose/Rate Route Frequency Ordered Stop   03/08/15 0430  cefTRIAXone (ROCEPHIN) 2 g in dextrose 5 % 50 mL IVPB - Premix     2 g 100 mL/hr over 30 Minutes Intravenous Daily 03/08/15 0419       Assessment: Patient admit with nausea/vomiting and now ARF and r/o UTI.    Goal of Therapy:  Rocephin based on manufacturer dosing recommendations.  Plan: Ceftriaxone 1gm iv q24hr Follow up culture results  Aleene DavidsonGrimsley Jr, Gianlucca Szymborski Crowford 03/08/2015,4:41 AM

## 2015-03-08 NOTE — Progress Notes (Signed)
Patient ID: John Wilkins, male   DOB: 04/12/1922, 81 y.o.   MRN: [id number]  [facility] PROGRESS NOTE  John HowellsJack Wilkins QIO:[id number] DOB: 04/12/1922 DOA: 03/07/2015 PCP: No primary care provider on file.   Brief narrative:    81 y.o. male with known history of dementia, chronic atrial fibrillation, chronic diastolic CHF, hypothyroidism, chronic kidney disease was brought to the ER after patient had persistent nausea and vomiting. Patient has been having these symptoms ongoing for last 2-3 weeks and was also recently admitted at [facility].   In the ER patient's Cr found to be elevated and baseline done 2 weeks ago was around 1.7. CT abd and pelvis unremarkable. UA showed features consistent with UTI and chest x-ray shows possible aspiration. On exam patient was demented and was not able to provide history. Abdomen appeared benign on exam.   Assessment/Plan:    Principal Problem:   Acute encephalopathy - appears to be multifactorial and secondary to acute UTI, ? Aspiration PNA, imposed on known dementia  - pt appears to be at baseline mental status per son who is at bedside - will change ABX Rocephin to Zosyn for now due to ? Aspiration PNA  - pt will need SLP and will try to change medications to IV for now until swallow test down  - will also need PT/OT evaluation, son ope to the idea of the SNF placement based on recommendations     Renal failure (ARF), acute on chronic, stage III - appears to be pre renal etiology imposed on CKD stage III - continue IVF and repeat BMP in AM   Active Problems:   Acute hypoxic respiratory failure - with oxygen saturations 87% on RA - still requiring oxygen via [place] - treating aspiration PNA with Zosyn     Nausea & vomiting - possibly secondary to UTI, aspiration PNA - will request SLP - provide antiemetics as needed    Hypokalemia - supplemented and WNL this AM    Diastolic CHF, chronic - appears still rather dry on exam, no  crackles or LE noted on exam - will continue gentle hydration for now - d/c tele monitor per family request to keep pt comfortable  - monitor daily weights and I's/O's - holding Lasix that pt takes at home  - repeat BMP in AM    Dementia with functional quadriplegia  - will need SLP, PT/OT - pt bed bound per son     Hypothyroidism - continue synthroid     Severe PCM - in the context of dementia and now acute illness - SLP requested     Chronic atrial fib - rate controlled - continue Cardizem and metoprolol per home medical regimen   DVT prophylaxis - Lovenox SQ  Code Status: Full.  Family Communication:  plan of care discussed with the patient Disposition Plan: Home when stable.   IV access:  Peripheral IV  Procedures and diagnostic studies:    Ct Abdomen Pelvis Wo Contrast  03/07/2015   No acute findings are evident in the abdomen or pelvis. 2. Multiple renal cysts bilaterally, indeterminate but more likely benign.   CXR 03/07/2015 Cardiomegaly without congestive failure.  Patchy left-sided opacities, ? infection or aspiration.  Medical Consultants:  None   Other Consultants:  SLP/OT/PT  IAnti-Infectives:   Rocephin 4/1 --> 4/2 Zosyn 4/2 -->  [name], MD  [facility] Pager [phone]  If 7PM-7AM, please contact night-coverage [email or web address] Password [email or web address] 03/08/2015, 12:43 PM   LOS: 0 days   HPI/Subjective: No  events overnight.   Objective: Filed Vitals:   03/07/15 2203 03/08/15 0200 03/08/15 0518 03/08/15 1007  BP: 155/97 139/83 153/74 150/72  Pulse: 72 106 73 78  Temp:  97.7 F (36.5 C) 97.6 F (36.4 C)   TempSrc:  Oral Oral   Resp: Height:     (1.753 m)  Weight:  72.213 kg (159 lb 3.2 oz)    SpO2: 92% 87% 95%     Intake/Output Summary (Last 24 hours) at 03/08/15 1243 Last data filed at 03/08/15 0630  Gross per 24 hour  Intake      0 ml  Output    300 ml  Net   -300 ml    Exam:   General:  Pt is sleeping but easy to  awake, NAD, answers yet to all questions but able to follow commands   Cardiovascular: Regular rate and rhythm, no rubs, no gallops  Respiratory: Clear to auscultation bilaterally, rhonchi at bases   Abdomen: Soft, non tender, non distended, bowel sounds present, no guarding  Extremities: No edema, pulses DP and PT palpable bilaterally  Data Reviewed: Basic Metabolic Panel:  Recent Labs Lab 03/07/15 2122 03/07/15 2124 03/08/15 0400  NA 145 145 144  K 3.4* 3.3* 3.6  CL 109 109 104  CO2 21  --  19  GLUCOSE 88 88 67*  BUN 72* 57* 67*  CREATININE 3.55* 3.40* 3.49*  CALCIUM 8.7  --  8.4   Liver Function Tests:  Recent Labs Lab 03/07/15 2122 03/08/15 0400  AST 13 14  ALT 9 9  ALKPHOS 103 97  BILITOT 0.7 0.9  PROT 7.2 6.7  ALBUMIN 3.1* 2.9*    Recent Labs Lab 03/07/15 2122  LIPASE 28   CBC:  Recent Labs Lab 03/07/15 2122 03/07/15 2124 03/08/15 0400  WBC 9.7  --  8.6  NEUTROABS 6.7  --  5.8  HGB 14.5 15.6 14.5  HCT 43.1 46.0 43.2  MCV 87.2  --  87.1  PLT 265  --  243    Scheduled Meds: . aspirin EC  81 mg Oral Daily  . diltiazem  180 mg Oral Daily  . donepezil  10 mg Oral QHS  . enoxaparin (LOVENOX) injection  40 mg Subcutaneous Q24H  . levothyroxine  112 mcg Oral QAC breakfast  . megestrol  40 mg Oral QID  . metoprolol tartrate  25 mg Oral BID  . pantoprazole  40 mg Oral Daily  . piperacillin-tazobactam (ZOSYN)  IV  2.25 g Intravenous 4 times per day  . pravastatin  20 mg Oral Daily   Continuous Infusions: . sodium chloride 50 mL/hr at 03/08/15 [id number]

## 2015-03-08 NOTE — H&P (Signed)
[facility] History and Physical  [name] ZOX:[id number] DOB: 1922/04/15 DOA: 03/07/2015  Referring physician: ER physician. PCP: No primary care provider on file. patient's primary care physician is in [place].  Chief Complaint: Nausea vomiting.  Most of the history was obtained from ER physician as at this time patient's family is not at the bedside.  HPI: John Wilkins is a 81 y.o. male with known history of dementia, chronic atrial fibrillation, chronic diastolic CHF, hypothyroidism, chronic kidney disease was brought to the ER after patient had persistent nausea and vomiting. Patient has been having these symptoms ongoing for last 2-3 weeks and was also recently admitted at [facility]. In the ER patient's creatinine is found to be elevated and baseline done 2 weeks ago was around 1.7. CT abdomen and pelvis is unremarkable. UA shows features consistent with UTI and chest x-ray shows possible aspiration. On exam patient is demented and does not contribute to the history. Abdomen appears benign on exam. Patient is not in distress. As per the ER physician patient lives at home and gets 24/7 care.  Review of Systems: As presented in the history of presenting illness, rest negative.  Past Medical History  Diagnosis Date  . Hypertension   . GERD (gastroesophageal reflux disease)   . Osteoarthritis   . Chronic kidney disease   . Dementia   . Hypothyroidism   . A-fib    History reviewed. No pertinent past surgical history. Social History:  reports that he does not drink alcohol. His tobacco and drug histories are not on file. Where does patient live home. Can patient participate in ADLs? No.  No Known Allergies  Family History:  Family History  Problem Relation Age of Onset  . Family history unknown: Yes      Prior to Admission medications   Medication Sig Start Date End Date Taking? Authorizing Provider  acetaminophen (TYLENOL) 500 MG tablet Take 500 mg by  mouth every 6 (six) hours as needed for mild pain.   Yes Historical Provider, MD  aspirin EC 81 MG tablet Take 81 mg by mouth daily.   Yes Historical Provider, MD  diltiazem (DILACOR XR) 180 MG 24 hr capsule Take 180 mg by mouth daily.   Yes Historical Provider, MD  donepezil (ARICEPT) 10 MG tablet Take 10 mg by mouth at bedtime.   Yes Historical Provider, MD  furosemide (LASIX) 20 MG tablet Take 20 mg by mouth 2 (two) times daily.   Yes Historical Provider, MD  levothyroxine (SYNTHROID, LEVOTHROID) 112 MCG tablet Take 112 mcg by mouth daily before breakfast.   Yes Historical Provider, MD  megestrol (MEGACE) 40 MG tablet Take 40 mg by mouth 4 (four) times daily.   Yes Historical Provider, MD  metoprolol tartrate (LOPRESSOR) 25 MG tablet Take 25 mg by mouth 2 (two) times daily.   Yes Historical Provider, MD  pantoprazole (PROTONIX) 40 MG tablet Take 40 mg by mouth daily.   Yes Historical Provider, MD  pravastatin (PRAVACHOL) 20 MG tablet Take 20 mg by mouth daily.   Yes Historical Provider, MD  traMADol (ULTRAM) 50 MG tablet Take 50 mg by mouth every 6 (six) hours as needed for moderate pain.   Yes Historical Provider, MD    Physical Exam: Filed Vitals:   03/07/15 1857 03/07/15 2203 03/08/15 0200  BP: 131/72 155/97 139/83  Pulse: 64 72 106  Temp: 98.6 F (37 C)  97.7 F (36.5 C)  TempSrc: Rectal  Oral  Resp: Weight:  72.213 kg (159 lb 3.2 oz)  SpO2: 95% 92% 87%     General:  Well-built and poorly nourished.  Eyes: Anicteric no pallor.  ENT: No discharge from the ears eyes nose or mouth.  Neck: No mass felt.  Cardiovascular: S1-S2 heard.  Respiratory: No rhonchi or crepitations.  Abdomen: Soft nontender gallstones present.  Skin: Skin tear.  Musculoskeletal: No edema.  Psychiatric: Patient is demented.  Neurologic: Patient is demented and does not provide any history. Moves all extremities and does not follow commands.  Labs on Admission:  Basic Metabolic  Panel:  Recent Labs Lab 03/07/15 2122 03/07/15 2124  NA 145 145  K 3.4* 3.3*  CL 109 109  CO2 21  --   GLUCOSE 88 88  BUN 72* 57*  CREATININE 3.55* 3.40*  CALCIUM 8.7  --    Liver Function Tests:  Recent Labs Lab 03/07/15 2122  AST 13  ALT 9  ALKPHOS 103  BILITOT 0.7  PROT 7.2  ALBUMIN 3.1*    Recent Labs Lab 03/07/15 2122  LIPASE 28   No results for input(s): AMMONIA in the last 168 hours. CBC:  Recent Labs Lab 03/07/15 2122 03/07/15 2124  WBC 9.7  --   NEUTROABS 6.7  --   HGB 14.5 15.6  HCT 43.1 46.0  MCV 87.2  --   PLT 265  --    Cardiac Enzymes: No results for input(s): CKTOTAL, CKMB, CKMBINDEX, TROPONINI in the last 168 hours.  BNP (last 3 results) No results for input(s): BNP in the last 8760 hours.  ProBNP (last 3 results) No results for input(s): PROBNP in the last 8760 hours.  CBG: No results for input(s): GLUCAP in the last 168 hours.  Radiological Exams on Admission: Ct Abdomen Pelvis Wo Contrast  03/07/2015   CLINICAL DATA:  Abdominal pain of 6 weeks duration.  EXAM: CT ABDOMEN AND PELVIS WITHOUT CONTRAST  TECHNIQUE: Multidetector CT imaging of the abdomen and pelvis was performed following the standard protocol without IV contrast.  COMPARISON:  None.  FINDINGS: There are unremarkable unenhanced appearances of the liver and bile ducts. There is prior cholecystectomy.  The spleen, pancreas and adrenals appear unremarkable.  Multiple renal cysts are present bilaterally, measuring up to 9 cm on the right and 8 cm on the left. These are not conclusively characterized in the absence of intravenous contrast but are more likely benign. Both kidneys are negative for calculus or hydronephrosis. Both ureters appear unremarkable. Urinary bladder is decompressed but grossly unremarkable.  The abdominal aorta is normal in caliber with mild atherosclerotic calcification.  There are unremarkable appearances of the stomach, small bowel and colon.  No acute  inflammatory changes are evident in the abdomen or pelvis. There is no ascites. There is no adenopathy.  Mild ground-glass opacities are present in the bases of both lungs, indeterminate with regard to infectious infiltrate versus atelectasis versus aspiration. There is a fat containing diaphragmatic hernia on the right posteriorly.  Multiple lower thoracic and lumbar vertebral compressions are present, more likely old. No discrete skeletal lesions are evident.  IMPRESSION: 1. No acute findings are evident in the abdomen or pelvis. 2. Multiple renal cysts bilaterally, indeterminate but more likely benign. 3. Mild airspace opacities in both lung bases, indeterminate with regard to infection versus atelectasis versus aspiration.   Electronically Signed   By: Ellery Plunkaniel R Mitchell M.D.   On: 03/07/2015 23:11   Dg Chest 1 View  03/07/2015   CLINICAL DATA:  Weakness.  Diarrhea.  EXAM: CHEST  1 VIEW  COMPARISON:  None.  FINDINGS: The Chin overlies the apices. High riding humeral heads, consistent with chronic rotator cuff insufficiency. Mild cardiomegaly with a tortuous thoracic aorta. No right and no definite left pleural effusion. No pneumothorax. Mild right base scarring. Patchy left mid and lower lung airspace disease.  IMPRESSION: Cardiomegaly without congestive failure.  Patchy left-sided opacities which are of indeterminate acuity. Cannot exclude infection or aspiration. Consider PA and lateral radiographs if possible.   Electronically Signed   By: Jeronimo Greaves M.D.   On: 03/07/2015 21:50     Assessment/Plan Principal Problem:   Renal failure (ARF), acute on chronic Active Problems:   Nausea & vomiting   Diastolic CHF, chronic   Dementia   Hypothyroidism   Chronic atrial fibrillation   1. Acute on chronic renal failure stage III - probably from poor oral intake and persistent nausea vomiting. At this time I have placed patient on gentle hydration and we will hold off patient's Lasix. Closely follow  intake and output. 2. Nausea vomiting - this has been an ongoing persistent issue. CT abdomen and pelvis is unremarkable. Cause is not clear. Could be viral. Closely observe. I have placed patient on clear liquid diet for now. 3. Possible aspiration - patient is on empiric antibiotics. Closely observe. 4. UTI - follow urine cultures. Patient is on ceftriaxone. 5. Chronic diastolic CHF - last EF measured was 60-65% in March 2015 - at this time patient looks dehydrated and we are holding off patient's diuretics and gently hydrating. 6. Hypothyroidism - on Synthroid. 7. Hyperlipidemia on statins. 8. Chronic atrial fibrillation - on aspirin and rate limiting medication which could be continued. Patient probably not a candidate for anticoagulation given his dementia and risk for falls. Chads 2vasc score is more than 2.  Palliative care consult for goals of care.   DVT Prophylaxis Lovenox.  Code Status: Full code.  Family Communication: Family not at bedside at this time.  Disposition Plan: Admit to inpatient.    Cristen Murcia N. [facility] Pager [phone].  If 7PM-7AM, please contact night-coverage [email or web address] Password [email or web address] 03/08/2015, 3:16 AM

## 2015-03-08 NOTE — Evaluation (Signed)
Occupational Therapy Evaluation Patient Details Name: John Wilkins [place] MRN: [id number] DOB: [birth year] Today's Date: 03/08/2015    History of Present Illness 81 y.o. male with known history of dementia, chronic atrial fibrillation, chronic diastolic CHF, hypothyroidism, chronic kidney disease was brought to the ER after patient had persistent nausea and vomiting. Patient has been having these symptoms ongoing for last 2-3 weeks and was also recently admitted at [facility].    Clinical Impression   Pt admitted with n/v currently with functional limitations due to the deficits listed below (see OT Problem List). Pt will benefit from skilled OT to increase their safety and independence with ADL and functional mobility for ADL to facilitate discharge to venue listed below.      Follow Up Recommendations  SNF    Equipment Recommendations  None recommended by OT       Precautions / Restrictions Precautions Precautions: Fall      Mobility Bed Mobility Overal bed mobility: Needs Assistance Bed Mobility: Supine to Sit     Supine to sit: Total assist     General bed mobility comments: pt total A to transition EOB.  Pt would verbalize need to lie dow after sitting up and return to supine.  Pt did this 3 times.   Transfers                 General transfer comment: did not perform    Balance Overall balance assessment: Needs assistance Sitting-balance support: Bilateral upper extremity supported;Feet supported Sitting balance-Leahy Scale: Poor   Postural control: Left lateral lean;Posterior lean                                  ADL Overall ADL's : Needs assistance/impaired Eating/Feeding: Maximal assistance;Bed level   Grooming: Wash/dry face;Bed level;Moderate assistance                                 General ADL Comments: Pt unable to maintain sitting I ly.  Pt needed total A with transitioning to EOB.  Unless pt has increased A as  home pt would need SNF.      Vision     Perception     Praxis      Pertinent Vitals/Pain Pain Assessment: No/denies pain     Hand Dominance     Extremity/Trunk Assessment Upper Extremity Assessment Upper Extremity Assessment: Generalized weakness           Communication     Cognition Arousal/Alertness: Lethargic   Overall Cognitive Status: No family/caregiver present to determine baseline cognitive functioning                     General Comments   limited participation            Home Living Family/patient expects to be discharged to:: Skilled nursing facility (pt will likely need SNF)                                        Prior Functioning/Environment               OT Diagnosis: Generalized weakness   OT Problem List: Decreased strength;Impaired balance (sitting and/or standing);Decreased safety awareness   OT Treatment/Interventions: Self-care/ADL training;Patient/family education;Therapeutic exercise    OT Goals(Current goals  can be found in the care plan section) Acute Rehab OT Goals Patient Stated Goal: did not state Time For Goal Achievement: 03/22/15 ADL Goals Pt Will Perform Eating: with min assist;sitting Pt Will Perform Grooming: with min assist;sitting Pt Will Transfer to Toilet: with min assist;bedside commode Pt Will Perform Toileting - Clothing Manipulation and hygiene: with min assist;sit to/from stand  OT Frequency: Min 2X/week   Barriers to D/C:            Co-evaluation      would be beneficial         End of Session Nurse Communication: Mobility status  Activity Tolerance: Patient limited by fatigue Patient left: in bed;with call bell/phone within reach;with bed alarm set   Time: 1450-1504 OT Time Calculation (min): 14 min Charges:  OT General Charges $OT Visit: 1 Procedure OT Evaluation $Initial OT Evaluation Tier I: 1 Procedure G-Codes:    Alba Cory 03/19/2015, 3:26  PM

## 2015-03-09 DIAGNOSIS — R630 Anorexia: Secondary | ICD-10-CM

## 2015-03-09 DIAGNOSIS — G9340 Encephalopathy, unspecified: Secondary | ICD-10-CM | POA: Insufficient documentation

## 2015-03-09 DIAGNOSIS — Z515 Encounter for palliative care: Secondary | ICD-10-CM | POA: Insufficient documentation

## 2015-03-09 LAB — BASIC METABOLIC PANEL
ANION GAP: 18 — AB (ref 5–15)
BUN: 56 mg/dL — ABNORMAL HIGH (ref 6–23)
CO2: 17 mmol/L — ABNORMAL LOW (ref 19–32)
CREATININE: 2.88 mg/dL — AB (ref 0.50–1.35)
Calcium: 9 mg/dL (ref 8.4–10.5)
Chloride: 113 mmol/L — ABNORMAL HIGH (ref 96–112)
GFR, EST AFRICAN AMERICAN: 21 mL/min — AB (ref >90)
GFR, EST NON AFRICAN AMERICAN: 18 mL/min — AB (ref >90)
Glucose, Bld: 93 mg/dL (ref 70–99)
POTASSIUM: 4.1 mmol/L (ref 3.5–5.1)
SODIUM: 148 mmol/L — AB (ref 135–145)

## 2015-03-09 LAB — URINE CULTURE
COLONY COUNT: NO GROWTH
Culture: NO GROWTH
SPECIAL REQUESTS: NORMAL

## 2015-03-09 LAB — CBC
HCT: 43.7 % (ref 39.0–52.0)
HEMOGLOBIN: 14.4 g/dL (ref 13.0–17.0)
MCH: 28.6 pg (ref 26.0–34.0)
MCHC: 33 g/dL (ref 30.0–36.0)
MCV: 86.9 fL (ref 78.0–100.0)
Platelets: 294 10*3/uL (ref 150–400)
RBC: 5.03 MIL/uL (ref 4.22–5.81)
RDW: 14.8 % (ref 11.5–15.5)
WBC: 9.8 10*3/uL (ref 4.0–10.5)

## 2015-03-09 MED ORDER — DEXTROSE 5 % IV SOLN
INTRAVENOUS | Status: DC
  Administered 2015-03-09 – 2015-03-10 (×2): via INTRAVENOUS

## 2015-03-09 NOTE — Evaluation (Signed)
Physical Therapy Evaluation Patient Details Name: John Wilkins MRN: [id number] DOB: 22-Mar-1922 Today's Date: 03/09/2015   History of Present Illness  81 y.o. male with known history of dementia, chronic atrial fibrillation, chronic diastolic CHF, hypothyroidism, chronic kidney disease was brought to the ER after patient had persistent nausea and vomiting, acute renal failure.   Clinical Impression  On eval, pt required Max-total assist +2 for mobility-able to sit EOB for several minutes. Pt stood briefly at EOB with RW ~5-10 seconds before requesting to sit back down. Family/caregiver present-state plan is for pt to return home. Pt has 24/7 care available. May need to consider placement if family/caregivers decide they can't manage with pt at home.     Follow Up Recommendations Home health PT;Supervision/Assistance - 24 hour (Pt has 24/7 caregivers available. May need to consider SNF if family decides they can't manage at home.)    Equipment Recommendations  None recommended by PT    Recommendations for Other Services       Precautions / Restrictions Precautions Precautions: Fall Restrictions Weight Bearing Restrictions: No      Mobility  Bed Mobility Overal bed mobility: Needs Assistance Bed Mobility: Supine to Sit;Sit to Supine;Sidelying to Sit   Sidelying to sit: +2 for physical assistance;+2 for safety/equipment;HOB elevated   Sit to supine: +2 for physical assistance;Max assist;+2 for safety/equipment   General bed mobility comments: Assist for trunk and bil LEs. Utilized bedpad for scooting, positioning. Increased time. Pt would intermittently assist a little.   Transfers Overall transfer level: Needs assistance Equipment used: Rolling walker (2 wheeled) Transfers: Sit to/from Stand Sit to Stand: Mod assist;+2 safety/equipment;From elevated surface         General transfer comment: Assist to rise, stabilize, control descent. Multimodal cues for safety, technique,  hand placement. Pt stood for ~5-10 seconds before requesting to sit back down. Pt declined another attempt to stand and requested to sidelying in bed.   Ambulation/Gait             General Gait Details: NT  Stairs            Wheelchair Mobility    Modified Rankin (Stroke Patients Only)       Balance Overall balance assessment: Needs assistance Sitting-balance support: Bilateral upper extremity supported;No upper extremity supported;Feet supported Sitting balance-Leahy Scale: Fair Sitting balance - Comments: Initally required Mod assist to maintain balance. Pt then progressed to sitting EOB with Min guard assist for several minutes.    Standing balance support: Bilateral upper extremity supported;During functional activity Standing balance-Leahy Scale: Poor                               Pertinent Vitals/Pain Pain Assessment: No/denies pain    Home Living Family/patient expects to be discharged to:: Unsure Living Arrangements: Alone Available Help at Discharge: Available 24 hours/day;Personal care attendant Type of Home: House         Home Equipment: Dan Humphreys - 2 wheels;Bedside commode;Wheelchair - manual      Prior Function Level of Independence: Needs assistance   Gait / Transfers Assistance Needed: HHPT has been working with pt-able to take a few steps with a walker ~1 week ago.            Hand Dominance        Extremity/Trunk Assessment   Upper Extremity Assessment: Defer to OT evaluation           Lower Extremity Assessment: Generalized  weakness      Cervical / Trunk Assessment: Kyphotic  Communication   Communication: HOH  Cognition Arousal/Alertness: Lethargic Behavior During Therapy: Flat affect Overall Cognitive Status: Difficult to assess                      General Comments      Exercises        Assessment/Plan    PT Assessment Patient needs continued PT services  PT Diagnosis Difficulty  walking;Generalized weakness   PT Problem List Decreased strength;Decreased activity tolerance;Decreased balance;Decreased mobility;Decreased cognition;Decreased knowledge of use of DME  PT Treatment Interventions DME instruction;Functional mobility training;Therapeutic activities;Therapeutic exercise;Balance training;Patient/family education   PT Goals (Current goals can be found in the Care Plan section) Acute Rehab PT Goals Patient Stated Goal: home per family/caregivers PT Goal Formulation: With family Time For Goal Achievement: 03/23/15 Potential to Achieve Goals: Fair    Frequency Min 3X/week   Barriers to discharge        Co-evaluation               End of Session Equipment Utilized During Treatment: Gait belt Activity Tolerance: Patient limited by lethargy;Patient limited by fatigue Patient left: in bed;with call bell/phone within reach;with bed alarm set;with family/visitor present           Time: 1051-1107 PT Time Calculation (min) (ACUTE ONLY): 16 min   Charges:   PT Evaluation $Initial PT Evaluation Tier I: 1 Procedure     PT G Codes:        John Wilkins, MPT Pager: [phone]

## 2015-03-09 NOTE — Consult Note (Signed)
Patient ZO:XWRU:John Wilkins      DOB: 03/19/1922      EAV:[id number]     Consult [facility] Pall[facility] Medicine [facility]    Consult Requested by: Dr John Wilkins     PCP: No primary care provider on file. Reason for Consultation: GOC    Phone Number:None  Assessment/Recommendations: 81 yo male with advanced dementia admitted with nausea/vomiting. Palliative consult for goals.   1.  Code Status: Currently listed as full code  2. GOC: Spoke some with caregiver at bedside. No etiology found for his nausea/vomiting in past work-up (unclear how extensive). It appears however that he has advanced dementia at baseline. I think from what they describe, he would certainly be eligible for hospice care.  His daytime caregiver John Wilkins told me her son John Wilkins would be family to contact and I have left him a message to call me back as I could not reach him today.  Of course these issues going forward need to be discussed with him.    3. Symptom Management:   1. Acute Encephalopathy: Caregiver describes sundowning for him. Avoid benzo's. I will add remeron to see if we can get him sleeping at night and also address some of the issues below. Certainly infections may be playing role as well.  2. Nausea/Vomiting: not using PRN's. Remeron has 5HT3 properties and can be helpful with nausea, so will add. Will see how he does and can consider other agents if needed.  3. Loss of Appetite: on megace already. Doubt remeron makes big difference here.    4. Psychosocial/Spiritual: Former [employer] Vet. Played for [employer]   Brief HPI: 81 yo male with PMHx of Advanced Dementia, Afib, HFpEF, CKD who was admitted on 4/1 with nausea/vomiting for 1-2 months duration per his daytime caregiver.  Due to his underlying dementia and acute encephalopathy, he is unable to provide any history. I am able to get some details from chart and his caregiver at bedside.  He was recently admitted to [facility]  in [place] for nausea/vomiting where they were told there was no known cause for his symptoms.  He has hardly been eating over past 1.195months and often when he takes in even small amounts, he has difficulty keeping these things down. At baseline, it sounds as if he has rather severe dementia.  He is functionally bedbound, not very conversant and often has confused speech when he does talk.  Rapid decline in cognitive and functional status over past few months.  He has reportedly been having trouble sleeping at night and being awake during day.  Here he has not had much recorded n/v. Has likely lost weight.   ROS: Unable to obtain with underlying dementia/encephalopathy    PMH:  Past Medical History  Diagnosis Date  . Hypertension   . GERD (gastroesophageal reflux disease)   . Osteoarthritis   . Chronic kidney disease   . Dementia   . Hypothyroidism   . A-fib      NWG:NFAOZHYPSH:History reviewed. No pertinent past surgical history. I have reviewed the FH and SH and  If appropriate update it with new information. No Known Allergies Scheduled Meds: . aspirin EC  81 mg Oral Daily  . diltiazem  180 mg Oral Daily  . enoxaparin (LOVENOX) injection  30 mg Subcutaneous Q24H  . levothyroxine  66 mcg Intravenous Daily  . megestrol  40 mg Oral QID  . metoprolol tartrate  25 mg Oral BID  . pantoprazole (PROTONIX) IV  40 mg Intravenous Daily  . piperacillin-tazobactam (ZOSYN)  IV  2.25 g Intravenous 4 times per day   Continuous Infusions:  PRN Meds:.acetaminophen **OR** acetaminophen, [DISCONTINUED] ondansetron **OR** ondansetron (ZOFRAN) IV, traMADol    BP 133/58 mmHg  Pulse 80  Temp(Src) 97.9 F (36.6 C) (Oral)  Resp 20  Ht  (1.753 m)  Wt 72.122 kg (159 lb)  BMI 23.47 kg/m2  SpO2 95%   PPS: 20   Intake/Output Summary (Last 24 hours) at 03/09/15 1429 Last data filed at 03/09/15 0539  Gross per 24 hour  Intake      0 ml  Output    500 ml  Net   -500 ml    Physical  Exam:  General: lethargic, NAD, arouses some to voice HEENT:  [place], sclera anicteric Neck: supple Chest:   CTAB CVS: regular rate Abdomen: soft, NT, ND Ext: no edema, warm Neuro: does not follow commands Skin: warm/dry Lymph: no palpable cervical adenopathy  Labs: CBC    Component Value Date/Time   WBC 9.8 03/09/2015 0435   RBC 5.03 03/09/2015 0435   HGB 14.4 03/09/2015 0435   HCT 43.7 03/09/2015 0435   PLT 294 03/09/2015 0435   MCV 86.9 03/09/2015 0435   MCH 28.6 03/09/2015 0435   MCHC 33.0 03/09/2015 0435   RDW 14.8 03/09/2015 0435   LYMPHSABS 1.6 03/08/2015 0400   MONOABS 1.0 03/08/2015 0400   EOSABS 0.2 03/08/2015 0400   BASOSABS 0.0 03/08/2015 0400    BMET    Component Value Date/Time   NA 148* 03/09/2015 0435   K 4.1 03/09/2015 0435   CL 113* 03/09/2015 0435   CO2 17* 03/09/2015 0435   GLUCOSE 93 03/09/2015 0435   BUN 56* 03/09/2015 0435   CREATININE 2.88* 03/09/2015 0435   CALCIUM 9.0 03/09/2015 0435   GFRNONAA 18* 03/09/2015 0435   GFRAA 21* 03/09/2015 0435    CMP     Component Value Date/Time   NA 148* 03/09/2015 0435   K 4.1 03/09/2015 0435   CL 113* 03/09/2015 0435   CO2 17* 03/09/2015 0435   GLUCOSE 93 03/09/2015 0435   BUN 56* 03/09/2015 0435   CREATININE 2.88* 03/09/2015 0435   CALCIUM 9.0 03/09/2015 0435   PROT 6.7 03/08/2015 0400   ALBUMIN 2.9* 03/08/2015 0400   AST 14 03/08/2015 0400   ALT 9 03/08/2015 0400   ALKPHOS 97 03/08/2015 0400   BILITOT 0.9 03/08/2015 0400   GFRNONAA 18* 03/09/2015 0435   GFRAA 21* 03/09/2015 0435   4/1 CXR IMPRESSION: Cardiomegaly without congestive failure.  Patchy left-sided opacities which are of indeterminate acuity. Cannot exclude infection or aspiration. Consider PA and lateral radiographs if possible.   4/1 CT Abd/Pelvis IMPRESSION: 1. No acute findings are evident in the abdomen or pelvis. 2. Multiple renal cysts bilaterally, indeterminate but more likely benign. 3. Mild airspace  opacities in both lung bases, indeterminate with regard to infection versus atelectasis versus aspiration.   Orvis Brill D.O. Palliative Medicine Team at [facility]  Pager: [phone] Team Phone: [phone]

## 2015-03-09 NOTE — Progress Notes (Signed)
Patient ID: John Wilkins, male   DOB: 1922/03/05, 81 y.o.   MRN: [id number]  [facility] PROGRESS NOTE  John Wilkins WUJ:[id number] DOB: Mar 09, 1922 DOA: 03/07/2015 PCP: No primary care provider on file.   Brief narrative:    81 y.o. male with known history of dementia, chronic atrial fibrillation, chronic diastolic CHF, hypothyroidism, chronic kidney disease was brought to the ER after patient had persistent nausea and vomiting. Patient has been having these symptoms ongoing for last 2-3 weeks and was also recently admitted at [facility].   In the ER patient's Cr found to be elevated and baseline done 2 weeks ago was around 1.7. CT abd and pelvis unremarkable. UA showed features consistent with UTI and chest x-ray shows possible aspiration. On exam patient was demented and was not able to provide history. Abdomen appeared benign on exam.   Assessment/Plan:    Principal Problem:   Acute encephalopathy - appears to be multifactorial and secondary to acute UTI, ? Aspiration PNA, imposed on known dementia  - pt appears to be at baseline mental status  - please note that ABX was changed to ZOsyn to cover for aspiration PNA, 4/2 - SLP done and recommendation is to continue thin liquid diet for now  - son not objected to SNF placement     Renal failure (ARF), acute on chronic, stage III - appears to be pre renal etiology imposed on CKD stage III - continue IVF, Cr is trending down - repeat BMP in AM  Active Problems:   Acute hypoxic respiratory failure - with oxygen saturations 87% on RA - still requiring oxygen via [place] - treating aspiration PNA with Zosyn     Nausea & vomiting - possibly secondary to UTI, aspiration PNA - better this AM, no episodes of vomiting noted since admission  - provide antiemetics as needed    Hypernatremia  - continue IVF but change to D5 for now and repeat BMP in AM    Hypokalemia - supplemented and WNL this AM    Diastolic CHF, chronic -  appears still rather dry on exam, no crackles or LE noted on exam - will continue gentle hydration for now - weight remains stable at 159 lbs  - monitor daily weights and I's/O's - holding Lasix that pt takes at home  - repeat BMP in AM    Dementia with functional quadriplegia  - pt bed bound per son     Hypothyroidism - continue synthroid     Severe PCM - in the context of dementia and now acute illness - SLP done and recommendation is to continue thin liquid diet     Chronic atrial fib - rate controlled - continue Cardizem and metoprolol per home medical regimen   DVT prophylaxis - Lovenox SQ  Code Status: Full.  Family Communication:  plan of care discussed with the patient Disposition Plan: Home when stable.   IV access:  Peripheral IV  Procedures and diagnostic studies:    Ct Abdomen Pelvis Wo Contrast  03/07/2015   No acute findings are evident in the abdomen or pelvis. 2. Multiple renal cysts bilaterally, indeterminate but more likely benign.   CXR 03/07/2015 Cardiomegaly without congestive failure.  Patchy left-sided opacities, ? infection or aspiration.  Medical Consultants:  None   Other Consultants:  SLP/OT/PT  IAnti-Infectives:   Rocephin 4/1 --> 4/2 Zosyn 4/2 -->  Debbora Presto, MD  [facility] Pager [phone]  If 7PM-7AM, please contact night-coverage [email or web address] Password [email or web address] 03/09/2015, 2:52 PM  LOS: 1 day   HPI/Subjective: No events overnight.   Objective: Filed Vitals:   03/08/15 2120 03/09/15 0437 03/09/15 0538 03/09/15 1310  BP: 137/74 140/75  133/58  Pulse: 72 80  80  Temp: 98 F (36.7 C) 98.8 F (37.1 C)  97.9 F (36.6 C)  TempSrc: Oral Oral  Oral  Resp: 20 18  20   Height:      Weight:   72.122 kg (159 lb)   SpO2: 95% 97%  95%    Intake/Output Summary (Last 24 hours) at 03/09/15 1452 Last data filed at 03/09/15 0539  Gross per 24 hour  Intake      0 ml  Output    500 ml  Net   -500 ml    Exam:   General:  Pt is  sleeping but easy to awake, NAD  Cardiovascular: Regular rate and rhythm, no rubs, no gallops  Respiratory: Clear to auscultation bilaterally, rhonchi at bases   Abdomen: Soft, non tender, non distended, bowel sounds present, no guarding  Extremities: No edema, pulses DP and PT palpable bilaterally  Data Reviewed: Basic Metabolic Panel:  Recent Labs Lab 03/07/15 2122 03/07/15 2124 03/08/15 0400 03/09/15 0435  NA 145 145 144 148*  K 3.4* 3.3* 3.6 4.1  CL 109 109 104 113*  CO2 21  --  19 17*  GLUCOSE 88 88 67* 93  BUN 72* 57* 67* 56*  CREATININE 3.55* 3.40* 3.49* 2.88*  CALCIUM 8.7  --  8.4 9.0   Liver Function Tests:  Recent Labs Lab 03/07/15 2122 03/08/15 0400  AST 13 14  ALT 9 9  ALKPHOS 103 97  BILITOT 0.7 0.9  PROT 7.2 6.7  ALBUMIN 3.1* 2.9*    Recent Labs Lab 03/07/15 2122  LIPASE 28   CBC:  Recent Labs Lab 03/07/15 2122 03/07/15 2124 03/08/15 0400 03/09/15 0435  WBC 9.7  --  8.6 9.8  NEUTROABS 6.7  --  5.8  --   HGB 14.5 15.6 14.5 14.4  HCT 43.1 46.0 43.2 43.7  MCV 87.2  --  87.1 86.9  PLT 265  --  243 294    Scheduled Meds: . aspirin EC  81 mg Oral Daily  . diltiazem  180 mg Oral Daily  . enoxaparin (LOVENOX) injection  30 mg Subcutaneous Q24H  . levothyroxine  66 mcg Intravenous Daily  . megestrol  40 mg Oral QID  . metoprolol tartrate  25 mg Oral BID  . pantoprazole (PROTONIX) IV  40 mg Intravenous Daily  . piperacillin-tazobactam (ZOSYN)  IV  2.25 g Intravenous 4 times per day   Continuous Infusions:

## 2015-03-10 ENCOUNTER — Other Ambulatory Visit: Payer: Self-pay

## 2015-03-10 ENCOUNTER — Inpatient Hospital Stay (HOSPITAL_COMMUNITY): Payer: Medicare PPO

## 2015-03-10 DIAGNOSIS — E43 Unspecified severe protein-calorie malnutrition: Secondary | ICD-10-CM | POA: Insufficient documentation

## 2015-03-10 DIAGNOSIS — R1314 Dysphagia, pharyngoesophageal phase: Secondary | ICD-10-CM | POA: Insufficient documentation

## 2015-03-10 LAB — CBC
HCT: 43.9 % (ref 39.0–52.0)
HEMOGLOBIN: 14.4 g/dL (ref 13.0–17.0)
MCH: 28.7 pg (ref 26.0–34.0)
MCHC: 32.8 g/dL (ref 30.0–36.0)
MCV: 87.5 fL (ref 78.0–100.0)
Platelets: 296 10*3/uL (ref 150–400)
RBC: 5.02 MIL/uL (ref 4.22–5.81)
RDW: 14.9 % (ref 11.5–15.5)
WBC: 9.2 10*3/uL (ref 4.0–10.5)

## 2015-03-10 LAB — BASIC METABOLIC PANEL
Anion gap: 13 (ref 5–15)
BUN: 52 mg/dL — AB (ref 6–23)
CALCIUM: 8.9 mg/dL (ref 8.4–10.5)
CO2: 22 mmol/L (ref 19–32)
CREATININE: 2.83 mg/dL — AB (ref 0.50–1.35)
Chloride: 113 mmol/L — ABNORMAL HIGH (ref 96–112)
GFR calc Af Amer: 21 mL/min — ABNORMAL LOW (ref >90)
GFR calc non Af Amer: 18 mL/min — ABNORMAL LOW (ref >90)
GLUCOSE: 126 mg/dL — AB (ref 70–99)
Potassium: 3.6 mmol/L (ref 3.5–5.1)
Sodium: 148 mmol/L — ABNORMAL HIGH (ref 135–145)

## 2015-03-10 LAB — MAGNESIUM: Magnesium: 2.3 mg/dL (ref 1.5–2.5)

## 2015-03-10 MED ORDER — MIRTAZAPINE 15 MG PO TBDP
15.0000 mg | ORAL_TABLET | Freq: Every day | ORAL | Status: DC
  Filled 2015-03-10 (×3): qty 1

## 2015-03-10 MED ORDER — MORPHINE SULFATE 2 MG/ML IJ SOLN
0.5000 mg | Freq: Four times a day (QID) | INTRAMUSCULAR | Status: DC | PRN
  Administered 2015-03-11 – 2015-03-12 (×2): 0.5 mg via INTRAVENOUS
  Filled 2015-03-10 (×2): qty 1

## 2015-03-10 NOTE — Progress Notes (Signed)
Shift Event: Pt with 16 beats of nonsustained Vtach. EKG- at baseline afib. Potassium and Mag and normal.  Illa LevelSahar Sadiq Mccauley [facility]

## 2015-03-10 NOTE — Progress Notes (Signed)
Physical Therapy Treatment Patient Details Name: John Wilkins MRN: [id number] DOB: 06/08/1922 Today's Date: 03/10/2015    History of Present Illness 81 y.o. male with known history of dementia, chronic atrial fibrillation, chronic diastolic CHF, hypothyroidism, chronic kidney disease was brought to the ER after patient had persistent nausea and vomiting, acute renal failure.     PT Comments    Son and long time personal care giver in room during session to observe pt's mobility level.  Assisted pt OOB to [facility] + 2 total assist then to recliner.  Pt was unable to take any functional steps and required extensive assist with standing while performing hygiene care after using BSC.  Family plans to have pt return home with personal care givers (as prior)  Follow Up Recommendations  Home health PT;Supervision/Assistance - 24 hour     Equipment Recommendations  None recommended by PT    Recommendations for Other Services       Precautions / Restrictions Precautions Precautions: Fall Restrictions Weight Bearing Restrictions: No    Mobility  Bed Mobility Overal bed mobility: Needs Assistance Bed Mobility: Supine to Sit   Sidelying to sit: Max assist       General bed mobility comments: repeat functional cueing and increased time to complete task.  required extra asssit with scooting to EOB.  Once EOB pt was able to stactic sit at Supervision level.  Poor forward flex posture.    Transfers Overall transfer level: Needs assistance Equipment used: Rolling walker (2 wheeled) Transfers: Sit to/from [employer] Sit to Stand: +2 safety/equipment;+2 physical assistance;Total assist (pt 15%)   Squat pivot transfers: +2 safety/equipment;+2 physical assistance;Total assist (pt 5%)     General transfer comment: pt required + 2 total assist to transfer from elevated bed to [facility].  Poor standing posture of b flex hips and knees.  Pt was unable to stand fully upright.  Poor  balance and hand over hand cueing for proper hand transition from [facility] to walker.  Greatest difficulty pivoting 1/4 turn from bed toBSC.  Getting off BSC, had to switch recliner from behind.    Ambulation/Gait             General Gait Details: pt unable to take any functional steps with transfer.    Stairs            Wheelchair Mobility    Modified Rankin (Stroke Patients Only)       Balance                                    Cognition Arousal/Alertness: Awake/alert Behavior During Therapy: Flat affect Overall Cognitive Status: Within Functional Limits for tasks assessed (pt speaks when he wants to)                      Exercises      General Comments        Pertinent Vitals/Pain Pain Assessment: No/denies pain    Home Living                      Prior Function            PT Goals (current goals can now be found in the care plan section) Progress towards PT goals: Progressing toward goals    Frequency  Min 3X/week    PT Plan      Co-evaluation  End of Session Equipment Utilized During Treatment: Gait belt Activity Tolerance: Patient tolerated treatment well Patient left: in chair;with family/visitor present;with call bell/phone within reach     Time: 1114-1140 PT Time Calculation (min) (ACUTE ONLY): 26 min  Charges:  $Therapeutic Activity: 23-37 mins                    G Codes:      Felecia ShellingLori Faryn Sieg  PTA [facility]  Acute  Rehab Pager      [phone]

## 2015-03-10 NOTE — Progress Notes (Signed)
Occupational Therapy Treatment Patient Details Name: Shanda HowellsJack Geis MRN: [id number] DOB: 1922-07-31 Today's Date: 03/10/2015    History of present illness 81 y.o. male with known history of dementia, chronic atrial fibrillation, chronic diastolic CHF, hypothyroidism, chronic kidney disease was brought to the ER after patient had persistent nausea and vomiting, acute renal failure.    OT comments  Pt will need 24/7 2 person A at home if DC home   Follow Up Recommendations  SNF    Equipment Recommendations       Recommendations for Other Services      Precautions / Restrictions Precautions Precautions: Fall Restrictions Weight Bearing Restrictions: No       Mobility Bed Mobility Overal bed mobility: Needs Assistance Bed Mobility: Sit to Supine   Sidelying to sit: Max assist   Sit to supine: Total assist;+2 for physical assistance   General bed mobility comments: repeat functional cueing and increased time to complete task.  required extra asssit with scooting to EOB.  Once EOB pt was able to stactic sit at Supervision level.  Poor forward flex posture.    Transfers Overall transfer level: Needs assistance Equipment used: 2 person hand held assist Transfers: Sit to/from [employer];Stand Pivot Transfers Sit to Stand: +2 safety/equipment;+2 physical assistance;Total assist   Squat pivot transfers: +2 safety/equipment;+2 physical assistance;Total assist             ADL                         Lower Body Dressing Details (indicate cue type and reason): pt was sitting on bed pan in chair upon OT arrival Toilet Transfer: Total assistance;+2 for physical assistance   Toileting- Clothing Manipulation and Hygiene: Sit to/from stand;+2 for physical assistance;Total assistance                          Cognition   Behavior During Therapy: Flat affect Overall Cognitive Status: Within Functional Limits for tasks assessed (pt speaks when he wants to)                                     Pertinent Vitals/ Pain       Pain Assessment: No/denies pain         Frequency       Progress Toward Goals  OT Goals(current goals can now be found in the care plan section)  Progress towards OT goals: Progressing toward goals     Plan Discharge plan remains appropriate       End of Session     Activity Tolerance Patient limited by fatigue   Patient Left in bed;with call bell/phone within reach;with bed alarm set   Nurse Communication Mobility status        Time: 1420-1450 OT Time Calculation (min): 30 min  Charges: OT General Charges $OT Visit: 1 Procedure OT Treatments $Self Care/Home Management : 23-37 mins  Ruby Dilone, Metro KungLorraine D 03/10/2015, 1:56 PM

## 2015-03-10 NOTE — Progress Notes (Signed)
Patient ID: John Wilkins, male   DOB: 05-Apr-1922, 81 y.o.   MRN: [id number]  [facility] PROGRESS NOTE  John HowellsJack Wilkins VHQ:[id number] DOB: 05-Apr-1922 DOA: 03/07/2015 PCP: No primary care provider on file.   Brief narrative:    81 y.o. male with known history of dementia, chronic atrial fibrillation, chronic diastolic CHF, hypothyroidism, chronic kidney disease was brought to the ER after patient had persistent nausea and vomiting. Patient has been having these symptoms ongoing for last 2-3 weeks and was [facility]   In the ER patient's Cr found to be elevated and baseline done 2 weeks ago was around 1.7. CT abd and pelvis unremarkable. UA showed features consistent with UTI and chest x-ray shows possible aspiration. On exam patient was demented and was not able to provide history. Abdomen appeared benign on exam.   Major events since admission: 4/4 - more vomiting after liquids, DG Esophagus unremarkable, plan for modified barium swallow study   Assessment/Plan:    Principal Problem:   Acute encephalopathy - appears to be multifactorial and secondary to acute UTI, ? Aspiration PNA, imposed on known dementia  - pt appears to be more alert this AM, sitting at the edge of the bed and follows commands appropriately  - please note that ABX was changed to Zosyn to cover for aspiration PNA, 4/2, will continue same regimen for now  - son not objected to SNF placement     Nausea and vomiting - event after clear and thin liquid diet - DG esophagus with no clear etiology - proceed with MBS study and may need GI consult  - continue Protonix IV    Renal failure (ARF), acute on chronic, stage III - appears to be pre renal etiology imposed on CKD stage III - continue IVF, Cr is trending down - repeat BMP in AM  Active Problems:   Acute hypoxic respiratory failure secondary to aspiration PNA  - with oxygen saturations 87% on RA - still requiring oxygen via  [place] - treating aspiration PNA with Zosyn     Hypernatremia  - still 148 this AM despite changing fluids to D5 - monitor closely     Hypokalemia - supplemented and WNL this AM    Diastolic CHF, chronic - appears still rather dry on exam, no crackles or LE noted on exam - will continue gentle hydration for now - weight remains stable at 158 - 159 lbs  - monitor daily weights and I's/O's - holding Lasix that pt takes at home  - repeat BMP in AM    Dementia with functional quadriplegia  - continue PT while inpatient     Hypothyroidism - continue synthroid     Severe PCM - in the context of dementia and now acute illness - SLP done and recommendation is to continue thin liquid diet     Chronic atrial fib - rate controlled - continue Cardizem and metoprolol per home medical regimen   DVT prophylaxis - Lovenox SQ  Code Status: Full.  Family Communication:  plan of care discussed with the patient adn son, caregiver at bedside  Disposition Plan: Home when stable.   IV access:  Peripheral IV  Procedures and diagnostic studies:    Ct Abdomen Pelvis Wo Contrast  03/07/2015   No acute findings are evident in the abdomen or pelvis. 2. Multiple renal cysts bilaterally, indeterminate but more likely benign.   CXR 03/07/2015 Cardiomegaly without congestive failure.  Patchy left-sided opacities, ? infection or aspiration.  Medical Consultants:  None   Other Consultants:  SLP/OT/PT  IAnti-Infectives:   Rocephin 4/1 --> 4/2 Zosyn 4/2 -->  Debbora Presto, MD  [facility] Pager [phone]  If 7PM-7AM, please contact night-coverage [email or web address] Password [email or web address] 03/10/2015, 2:22 PM   LOS: 2 days   HPI/Subjective: No events overnight. Vomiting this AM   Objective: Filed Vitals:   03/10/15 0445 03/10/15 0509 03/10/15 1108 03/10/15 1352  BP: 122/64  123/71 149/97  Pulse: 91  86 88  Temp: 98.9 F (37.2 C)  98.4 F (36.9 C) 97.7 F (36.5 C)  TempSrc: Oral  Axillary Axillary   Resp: Height:      Weight:  72 kg (158 lb 11.7 oz)    SpO2: 99%  97% 100%    Intake/Output Summary (Last 24 hours) at 03/10/15 1422 Last data filed at 03/10/15 0900  Gross per 24 hour  Intake 981.67 ml  Output      0 ml  Net 981.67 ml    Exam:   General:  Pt is more alert this AM,   Cardiovascular: Regular rate and rhythm, no rubs, no gallops  Respiratory: Clear to auscultation bilaterally, rhonchi at bases   Abdomen: Soft, non tender, non distended, bowel sounds present, no guarding  Extremities: No edema, pulses DP and PT palpable bilaterally  Data Reviewed: Basic Metabolic Panel:  Recent Labs Lab 03/07/15 2122 03/07/15 2124 03/08/15 0400 03/09/15 0435 03/10/15 0254  NA 145 145 144 148* 148*  K 3.4* 3.3* 3.6 4.1 3.6  CL 109 109 104 113* 113*  CO2 21  --  19 17* 22  GLUCOSE 88 88 67* 93 126*  BUN 72* 57* 67* 56* 52*  CREATININE 3.55* 3.40* 3.49* 2.88* 2.83*  CALCIUM 8.7  --  8.4 9.0 8.9  MG  --   --   --   --  2.3   Liver Function Tests:  Recent Labs Lab 03/07/15 2122 03/08/15 0400  AST 13 14  ALT 9 9  ALKPHOS 103 97  BILITOT 0.7 0.9  PROT 7.2 6.7  ALBUMIN 3.1* 2.9*    Recent Labs Lab 03/07/15 2122  LIPASE 28   CBC:  Recent Labs Lab 03/07/15 2122 03/07/15 2124 03/08/15 0400 03/09/15 0435 03/10/15 0254  WBC 9.7  --  8.6 9.8 9.2  NEUTROABS 6.7  --  5.8  --   --   HGB 14.5 15.6 14.5 14.4 14.4  HCT 43.1 46.0 43.2 43.7 43.9  MCV 87.2  --  87.1 86.9 87.5  PLT 265  --  243 294 296    Scheduled Meds: . diltiazem  180 mg Oral Daily  . levothyroxine  66 mcg Intravenous Daily  . metoprolol tartrate  25 mg Oral BID  . mirtazapine  15 mg Oral QHS  . pantoprazole (PROTONIX) IV  40 mg Intravenous Daily  . piperacillin-tazobactam (ZOSYN)  IV  2.25 g Intravenous 4 times per day   Continuous Infusions: . dextrose 50 mL/hr at 03/09/15 1534

## 2015-03-10 NOTE — Progress Notes (Signed)
I have reviewed the Barium esophagram and discussed results with patient's son. Liquid barium passes without delay but barium tablet, pt  unable to initiate swallow- this is a neuromuscular problem, possibly related to dementia/stroke. I agree with speech Path evaluation to recommend suitable diet. Son does not want to consider PEG or FT.  Lina Sarora Tarahji Ramthun MD, [facility], [phone]

## 2015-03-10 NOTE — Progress Notes (Signed)
Patient John Wilkins      DOB: 12-20-1921      EAV:[id number]   Palliative Medicine Team at [facility] Progress Note    Subjective: Went for esophogram. Very drowsy this morning on return. Vomiting with food this morning.    Filed Vitals:   03/10/15 0445  BP: 122/64  Pulse: 91  Temp: 98.9 F (37.2 C)  Resp: 18   Physical exam: GEN: NAD Skin: cool ext Neuro: confused, minimally verbal  CBC    Component Value Date/Time   WBC 9.2 03/10/2015 0254   RBC 5.02 03/10/2015 0254   HGB 14.4 03/10/2015 0254   HCT 43.9 03/10/2015 0254   PLT 296 03/10/2015 0254   MCV 87.5 03/10/2015 0254   MCH 28.7 03/10/2015 0254   MCHC 32.8 03/10/2015 0254   RDW 14.9 03/10/2015 0254   LYMPHSABS 1.6 03/08/2015 0400   MONOABS 1.0 03/08/2015 0400   EOSABS 0.2 03/08/2015 0400   BASOSABS 0.0 03/08/2015 0400    CMP     Component Value Date/Time   NA 148* 03/10/2015 0254   K 3.6 03/10/2015 0254   CL 113* 03/10/2015 0254   CO2 22 03/10/2015 0254   GLUCOSE 126* 03/10/2015 0254   BUN 52* 03/10/2015 0254   CREATININE 2.83* 03/10/2015 0254   CALCIUM 8.9 03/10/2015 0254   PROT 6.7 03/08/2015 0400   ALBUMIN 2.9* 03/08/2015 0400   AST 14 03/08/2015 0400   ALT 9 03/08/2015 0400   ALKPHOS 97 03/08/2015 0400   BILITOT 0.9 03/08/2015 0400   GFRNONAA 18* 03/10/2015 0254   GFRAA 21* 03/10/2015 0254     Assessment and plan: 81 yo male with advanced dementia admitted with nausea/vomiting. Palliative consult for goals.   1. Code Status: Full Code - I spoke with son John Wilkins today.  They had this conversation with PCP in January, and reportedly decided on keeping him full code at that time.  It sounds like they asked John Wilkins his input as well, but it seems unlikely that he really comprehended full extent of this conversation given his dementia. His caregiver John Wilkins and John Wilkins also question this.  I encouraged them to think about what we would hope to achieve by doing CPR/Intubation etc as the outcome  for survival is very low and likely would have dismal outcome should he survive. I passed along this information as well. John Wilkins will talk to his brother about this further.   2. GOC: See initial consult. Spoke with John Wilkins today. I think John Wilkins is really struggling with his father's decline.  He was not expecting to be in this situation at this time.  We talked about how concerning his declining trajectory in fact is and also the high likelihood of setbacks in the near.  His bedbound status, swallowing difficulty, lack of appetite, worsening cognition/strength, PNA are all signs that point to him being in the end-stages of his dementia.  They have been considering SNF, but we also talked that from a cognitive standpoint he may do worse as he would be out of his home environment. He has paid 24h caregivers in home currently. I think a lot of this information hit John Wilkins hard today.  [place] would certainly be eligible for home hospice care, but we did not discuss given the difficult conversation as above.  Will continue to follow along.  May not have these issues settled prior to discharge.  If not, would recommend outpatient palliative care services to continue building on these conversations.  3. Symptom Management:  1. Acute Encephalopathy: Caregiver describes sundowning for him. Avoid benzo's. I will add remeron to see if we can get him sleeping at night and also address some of the issues below. Certainly infections may be playing role as well. 2. Nausea/Vomiting: remeron. Not sure of etiology. Will see how he does 3. Loss of Appetite: on megace already. Doubt remeron makes big difference here.   4. Psychosocial/Spiritual: Former [employer] Vet. Played for [employer]. Has 2 sons.    Total Time: 30 minutes >50% of time spent in counseling and coordination of care regarding above.  John BrillAaron J. John Wilkins D.O. Palliative Medicine Team at [facility]  Pager: [phone] Team Phone:  [phone]

## 2015-03-10 NOTE — Progress Notes (Signed)
INITIAL NUTRITION ASSESSMENT  DOCUMENTATION CODES Per approved criteria  -Severe malnutrition in the context of chronic illness  Pt meets criteria for severe MALNUTRITION in the context of chronic illness as evidenced by energy intake <75% for >/= 1 month and severe muscle depletion.  INTERVENTION: RD to follow-up at a later date, monitoring goals of care, results of MBS and supplement needs  NUTRITION DIAGNOSIS: Inadequate oral intake related to advanced age/ dementia as evidenced by poor PO intake <25% x 1 month.   Goal: Pt to meet >/= 90% of their estimated nutrition needs   Monitor:  PO and supplemental intake, GOC, weight, labs, I/O's  Reason for Assessment: Pt identified as at nutrition risk on the Malnutrition Screen Tool  Admitting Dx: Renal failure (ARF), acute on chronic  ASSESSMENT: 81 y.o. male with known history of dementia, chronic atrial fibrillation, chronic diastolic CHF, hypothyroidism, chronic kidney disease was brought to the ER after patient had persistent nausea and vomiting. Patient has been having these symptoms ongoing for last 2-3 weeks.  Pt in room with son at bedside. Pt unable to provide history.  Per son, pt was eating normally in January at a luncheon he attended with his son. Caregivers not present in room but son states that caregivers have noticed a decline in his appetite and intake. In the last 2-3 weeks, pt has been experiencing N/V and abdominal pain. Currently pt is unable to hold any food or liquids down.  SLP is following and recommends thin liquid diet at this time. MBS is planned.  Son unsure of pt's UBW .  Pt's son states that caregivers used to make milkshakes for pt but he is unsure if pt could keep anything down at this time.  Nutrition Focused Physical Exam:  Subcutaneous Fat:  Orbital Region: moderate depletion Upper Arm Region: moderate depletion Thoracic and Lumbar Region:NA  Muscle:  Temple Region: moderate  depletion Clavicle Bone Region: severe depletion Clavicle and Acromion Bone Region: severe depletion Scapular Bone Region: NA Dorsal Hand: moderate-severe depletion Patellar Region: NA Anterior Thigh Region: NA Posterior Calf Region: NA  Edema: no LE edema  RD will continue to monitor for GOC, MBS results and supplemental needs.  Labs reviewed: Elevated BUN & Creatinine Elevated Na   Height: Ht Readings from Last 1 Encounters:  03/08/15  (1.753 m)    Weight: Wt Readings from Last 1 Encounters:  03/10/15 158 lb 11.7 oz (72 kg)    Ideal Body Weight: 160 lb  % Ideal Body Weight: 99%  Wt Readings from Last 10 Encounters:  03/10/15 158 lb 11.7 oz (72 kg)    BMI:  Body mass index is 23.43 kg/(m^2).  Estimated Nutritional Needs: Kcal: 1600-1800 Protein: 90-100g Fluid: 1.6L/day  Skin: open laceration on leg  Diet Order: Diet clear liquid Room service appropriate?: Yes; Fluid consistency:: Thin  EDUCATION NEEDS: -No education needs identified at this time   Intake/Output Summary (Last 24 hours) at 03/10/15 1458 Last data filed at 03/10/15 0900  Gross per 24 hour  Intake 981.67 ml  Output      0 ml  Net 981.67 ml    Last BM: unknown  Labs:   Recent Labs Lab 03/08/15 0400 03/09/15 0435 03/10/15 0254  NA 144 148* 148*  K 3.6 4.1 3.6  CL 104 113* 113*  CO2 19 17* 22  BUN 67* 56* 52*  CREATININE 3.49* 2.88* 2.83*  CALCIUM 8.4 9.0 8.9  MG  --   --  2.3  GLUCOSE  67* 93 126*    CBG (last 3)  No results for input(s): GLUCAP in the last 72 hours.  Scheduled Meds: . diltiazem  180 mg Oral Daily  . levothyroxine  66 mcg Intravenous Daily  . metoprolol tartrate  25 mg Oral BID  . mirtazapine  15 mg Oral QHS  . pantoprazole (PROTONIX) IV  40 mg Intravenous Daily  . piperacillin-tazobactam (ZOSYN)  IV  2.25 g Intravenous 4 times per day    Continuous Infusions: . dextrose 50 mL/hr at 03/09/15 1534    Past Medical History  Diagnosis Date  .  Hypertension   . GERD (gastroesophageal reflux disease)   . Osteoarthritis   . Chronic kidney disease   . Dementia   . Hypothyroidism   . A-fib     History reviewed. No pertinent past surgical history.  Tilda FrancoLindsey Cecillia Menees, MS, RD, LDN Pager: [phone] After Hours Pager: [phone]

## 2015-03-11 LAB — CLOSTRIDIUM DIFFICILE BY PCR: Toxigenic C. Difficile by PCR: NEGATIVE

## 2015-03-11 LAB — CBC
HCT: 42.6 % (ref 39.0–52.0)
Hemoglobin: 14.1 g/dL (ref 13.0–17.0)
MCH: 28.7 pg (ref 26.0–34.0)
MCHC: 33.1 g/dL (ref 30.0–36.0)
MCV: 86.8 fL (ref 78.0–100.0)
PLATELETS: 263 10*3/uL (ref 150–400)
RBC: 4.91 MIL/uL (ref 4.22–5.81)
RDW: 14.9 % (ref 11.5–15.5)
WBC: 8.6 10*3/uL (ref 4.0–10.5)

## 2015-03-11 LAB — BASIC METABOLIC PANEL
Anion gap: 10 (ref 5–15)
BUN: 42 mg/dL — AB (ref 6–23)
CO2: 23 mmol/L (ref 19–32)
CREATININE: 2.45 mg/dL — AB (ref 0.50–1.35)
Calcium: 8.9 mg/dL (ref 8.4–10.5)
Chloride: 113 mmol/L — ABNORMAL HIGH (ref 96–112)
GFR calc Af Amer: 25 mL/min — ABNORMAL LOW (ref >90)
GFR calc non Af Amer: 22 mL/min — ABNORMAL LOW (ref >90)
GLUCOSE: 111 mg/dL — AB (ref 70–99)
POTASSIUM: 3 mmol/L — AB (ref 3.5–5.1)
Sodium: 146 mmol/L — ABNORMAL HIGH (ref 135–145)

## 2015-03-11 MED ORDER — DEXTROSE 5 % IV SOLN
Freq: Once | INTRAVENOUS | Status: AC
  Administered 2015-03-11: 10:00:00 via INTRAVENOUS
  Filled 2015-03-11: qty 1000

## 2015-03-11 MED ORDER — POTASSIUM CHLORIDE 2 MEQ/ML IV SOLN
INTRAVENOUS | Status: DC
  Administered 2015-03-11: 11:00:00 via INTRAVENOUS

## 2015-03-11 MED ORDER — METOPROLOL TARTRATE 1 MG/ML IV SOLN
5.0000 mg | Freq: Four times a day (QID) | INTRAVENOUS | Status: DC
  Administered 2015-03-11 – 2015-03-13 (×9): 5 mg via INTRAVENOUS
  Filled 2015-03-11 (×9): qty 5

## 2015-03-11 MED ORDER — METOPROLOL TARTRATE 1 MG/ML IV SOLN
2.5000 mg | Freq: Once | INTRAVENOUS | Status: AC
  Administered 2015-03-11: 2.5 mg via INTRAVENOUS
  Filled 2015-03-11: qty 5

## 2015-03-11 NOTE — Progress Notes (Signed)
SLP Cancellation Note  Patient Details Name: John Wilkins MRN: [id number] DOB: 1922-09-12   Cancelled treatment:       Reason Eval/Treat Not Completed: Patient not medically ready  - SLP spoke to RN who reports pt with extreme pain, now sleeping after provided with morphine.   RN reports pt coughing on secretions at this time.    Do not recommend MBS as do not anticipate will change outcomes and likely will be uncomfortable for pt to complete.  MD please advise.  Will follow up clinically as pt appeared to tolerate clears well on Saturday during initial evaluation - concern for change present given coughing on secretions.     Mickie BailKimball, [place] Ann Oniel Meleski FredericKimball, [place] SLP [phone]

## 2015-03-11 NOTE — Progress Notes (Addendum)
Patient ID: John Wilkins [place], male   DOB: Feb 07, 1922, 81 y.o.   MRN: [id number]  [facility] PROGRESS NOTE  John Wilkins Kwasnik WUJ:[id number] DOB: Feb 07, 1922 DOA: 03/07/2015 PCP: No primary care provider on file.   Brief narrative:    81 y.o. male with known history of dementia, chronic atrial fibrillation, chronic diastolic CHF, hypothyroidism, chronic kidney disease was brought to the ER after patient had persistent nausea and vomiting. Patient has been having these symptoms ongoing for last 2-3 weeks and was also [facility].   In the ER patient's Cr found to be elevated and baseline done 2 weeks ago was around 1.7. CT abd and pelvis unremarkable. UA showed features consistent with UTI and chest x-ray shows possible aspiration. On exam patient was demented and was not able to provide history. Abdomen appeared benign on exam.   Major events since admission: 4/4 - more vomiting after liquids, DG Esophagus unremarkable, MBS (pt unable to swallow barium pill)   Assessment/Plan:    Principal Problem:   Acute encephalopathy - appears to be multifactorial and secondary to acute UTI, ? Aspiration PNA, imposed on known dementia  - pt appears to be more alert this AM, sitting at the edge of the bed and follows commands appropriately  - please note that ABX was changed to Zosyn to cover for aspiration PNA, 4/2, will continue Zosyn day 4 out of 7  - son not objected to SNF placement when pt ready     Nausea and vomiting - DG esophagus with no clear etiology - MBS passes without delay but barium tablet unable to pass - per GI specialist, this is likely neuromuscular problem in the setting of advancing dementia and stroke - we have changed PO medications to IV if this was possible, son made aware of the changes and does not want pt to have PEG or FT - GI team has signed off, we appreciate recommendations  - allow feeding as pt able to tolerate - provide antiemetics as needed      ? UTI - UA with trace leukocytes, urine culture with no growth    Renal failure (ARF), acute on chronic, stage III - pre renal etiology imposed on CKD stage III - continuing IVF, Cr is trending down: 3.55 --> 3.49 --> 2.88 --> 2.83 --> 2.45 - repeat BMP in AM  Active Problems:   Acute hypoxic respiratory failure secondary to aspiration PNA  - with oxygen saturations 87% on RA on admission  - still requiring oxygen via [place], will try to taper off oxygen if possible  - treating aspiration PNA with Zosyn day 4 out of 7     Hypernatremia  - Na slowly trending down: 148 --> 146 - continue D5 for now until oral intake improves  - monitor closely     Hypokalemia - supplement in IVF D5     Diastolic CHF, chronic - appears still rather dry on exam, no crackles or LE noted on exam - will continue gentle hydration for now - weight trending down since admission: 159 lbs --> 155 lbs this AM  - monitor daily weights and I's/O's - holding Lasix that pt takes at home for now - repeat BMP in AM    Dementia with functional quadriplegia  - continue PT while inpatient  - son agreeable to SNF - SW consulted for placement assistance     Hypothyroidism - continue synthroid, changed to IV for now     Severe PCM - in the  context of dementia and now acute illness - SLP done and recommendation is to continue thin liquid diet as pt able to tolerate     Chronic atrial fib - rate controlled - changed to Metoprolol IV until pt able to tolerate PO better - d/c metoprolol as pt not able to swallow pills  - upon discharge, change to liquid form of metoprolol   DVT prophylaxis - Lovenox SQ  Code Status: Full.  Family Communication:  plan of care discussed with the patient and son, caregiver at bedside. Palliative care team following.  Disposition Plan: SNF when vomiting resolved and pt able to tolerate at least liquid diet   IV access:  Peripheral IV  Procedures and diagnostic studies:     Ct Abdomen Pelvis Wo Contrast  03/07/2015   No acute findings are evident in the abdomen or pelvis. 2. Multiple renal cysts bilaterally, indeterminate but more likely benign.   CXR 03/07/2015 Cardiomegaly without congestive failure.  Patchy left-sided opacities, ? infection or aspiration.  Medical Consultants:  GI  Palliative care team   Other Consultants:  SLP/OT/PT  IAnti-Infectives:   Rocephin 4/1 --> 4/2 Zosyn 4/2 --> 4/8  Debbora Presto, MD  [facility] Pager [phone]  If 7PM-7AM, please contact night-coverage [email or web address] Password [facility] 03/11/2015, 4:43 PM   LOS: 3 days   HPI/Subjective: No events overnight. Vomiting this AM   Objective: Filed Vitals:   03/10/15 2053 03/11/15 0028 03/11/15 0425 03/11/15 1317  BP: 143/85 138/77 139/88 146/91  Pulse: 91 88 85 79  Temp: 98 F (36.7 C)  97.6 F (36.4 C) 98.1 F (36.7 C)  TempSrc: Axillary  Axillary Axillary  Resp: Height:      Weight:   70.6 kg (155 lb 10.3 oz)   SpO2: 93%  97% 98%    Intake/Output Summary (Last 24 hours) at 03/11/15 1643 Last data filed at 03/11/15 1500  Gross per 24 hour  Intake   1040 ml  Output    200 ml  Net    840 ml    Exam:   General:  Pt is more alert this AM, NAD  Cardiovascular: Regular rate and rhythm, no rubs, no gallops  Respiratory: Clear to auscultation bilaterally, rhonchi at bases but no wheezing   Abdomen: Soft, non tender, non distended, bowel sounds present, no guarding  Extremities: No edema, pulses DP and PT palpable bilaterally  Data Reviewed: Basic Metabolic Panel:  Recent Labs Lab 03/07/15 2122 03/07/15 2124 03/08/15 0400 03/09/15 0435 03/10/15 0254 03/11/15 0528  NA 145 145 144 148* 148* 146*  K 3.4* 3.3* 3.6 4.1 3.6 3.0*  CL 109 109 104 113* 113* 113*  CO2 21  --  19 17* 22 23  GLUCOSE 88 88 67* 93 126* 111*  BUN 72* 57* 67* 56* 52* 42*  CREATININE 3.55* 3.40* 3.49* 2.88* 2.83* 2.45*  CALCIUM 8.7  --  8.4 9.0 8.9 8.9  MG  --   --    --   --  2.3  --    Liver Function Tests:  Recent Labs Lab 03/07/15 2122 03/08/15 0400  AST 13 14  ALT 9 9  ALKPHOS 103 97  BILITOT 0.7 0.9  PROT 7.2 6.7  ALBUMIN 3.1* 2.9*    Recent Labs Lab 03/07/15 2122  LIPASE 28   CBC:  Recent Labs Lab 03/07/15 2122 03/07/15 2124 03/08/15 0400 03/09/15 0435 03/10/15 0254 03/11/15 0528  WBC 9.7  --  8.6 9.8 9.2 8.6  NEUTROABS 6.7  --  5.8  --   --   --   HGB 14.5 15.6 14.5 14.4 14.4 14.1  HCT 43.1 46.0 43.2 43.7 43.9 42.6  MCV 87.2  --  87.1 86.9 87.5 86.8  PLT 265  --  243 294 296 263    Scheduled Meds: . levothyroxine  66 mcg Intravenous Daily  . metoprolol  5 mg Intravenous 4 times per day  . mirtazapine  15 mg Oral QHS  . pantoprazole (PROTONIX) IV  40 mg Intravenous Daily  . piperacillin-tazobactam (ZOSYN)  IV  2.25 g Intravenous 4 times per day   Continuous Infusions:

## 2015-03-11 NOTE — Progress Notes (Signed)
Patient ZO:XWRU:John Wilkins      DOB: [birth year]      EAV:[id number]   [facility] Progress Note    Subjective: Much more alert today. Caregiver states he is much more confused than usual.  Can handle some basic conversation with me today. Denies Pain, N/V, dyspnea.    Filed Vitals:   03/11/15 1317  BP: 146/91  Pulse: 79  Temp: 98.1 F (36.7 C)  Resp: 20   Physical exam: GEN: more alert, NAD CV: RRR LUNGS: CTAB ABD; soft, NT EXT: no edema Neuro: confused  CBC    Component Value Date/Time   WBC 8.6 03/11/2015 0528   RBC 4.91 03/11/2015 0528   HGB 14.1 03/11/2015 0528   HCT 42.6 03/11/2015 0528   PLT 263 03/11/2015 0528   MCV 86.8 03/11/2015 0528   MCH 28.7 03/11/2015 0528   MCHC 33.1 03/11/2015 0528   RDW 14.9 03/11/2015 0528   LYMPHSABS 1.6 03/08/2015 0400   MONOABS 1.0 03/08/2015 0400   EOSABS 0.2 03/08/2015 0400   BASOSABS 0.0 03/08/2015 0400     CMP     Component Value Date/Time   NA 146* 03/11/2015 0528   K 3.0* 03/11/2015 0528   CL 113* 03/11/2015 0528   CO2 23 03/11/2015 0528   GLUCOSE 111* 03/11/2015 0528   BUN 42* 03/11/2015 0528   CREATININE 2.45* 03/11/2015 0528   CALCIUM 8.9 03/11/2015 0528   PROT 6.7 03/08/2015 0400   ALBUMIN 2.9* 03/08/2015 0400   AST 14 03/08/2015 0400   ALT 9 03/08/2015 0400   ALKPHOS 97 03/08/2015 0400   BILITOT 0.9 03/08/2015 0400   GFRNONAA 22* 03/11/2015 0528   GFRAA 25* 03/11/2015 0528     Assessment and plan: 81 yo male with advanced dementia admitted with nausea/vomiting. Palliative consult for goals.   1. Code Status: Full Code -see prior notes  2. GOC: Spoke with John Wilkins again today.  I think he continues to struggle with where things are at with John Wilkins. He spoke to GI about swallowing, N/V issues and it seems that this is likely related to neuromuscular issues in setting of advanced dementia.  I think given this information, hospice and comfort care should be real  consideration, as he almost certainly will have continued decline from here.  I mentioned the idea of hospice care to John Wilkins today and he needs to talk with John brother more. They both plan to be in tomorrow morning and I will try and talk with them further.  He does have 24h caregivers at home, so home with hospice care could be an option should family chose to move towards comfort.    3. Symptom Management:  1. Acute Encephalopathy: Caregiver describes sundowning for him. Avoid benzo's. Remeron. Improving with tx of infection. Not yet at baseline 2. Loss of Appetite: on megace already. Added remeron as above.    4. Psychosocial/Spiritual: Former [employer] Vet. Played for [employer]. Has 2 sons.   John Wilkins D.O. Palliative Medicine Team at [facility]  Pager: [phone] Team Phone: [phone]

## 2015-03-11 NOTE — Progress Notes (Signed)
ANTIBIOTIC CONSULT NOTE   Pharmacy Consult for Zosyn Indication: UTI, suspected aspiration pneumonia  No Known Allergies  Patient Measurements: Height: 5\' 9"  (175.3 cm) Weight: 155 lb 10.3 oz (70.6 kg) IBW/kg (Calculated) : 70.7 Adjusted Body Weight:   Vital Signs: Temp: 97.6 F (36.4 C) (04/05 0425) Temp Source: Axillary (04/05 0425) BP: 139/88 mmHg (04/05 0425) Pulse Rate: 85 (04/05 0425) Intake/Output from previous day: 04/04 0701 - 04/05 0700 In: 1460 [P.O.:160; I.V.:1100; IV Piggyback:200] Out: 100 [Urine:100] Intake/Output from this shift:    Labs:  Recent Labs  03/09/15 0435 03/10/15 0254 03/11/15 0528  WBC 9.8 9.2 8.6  HGB 14.4 14.4 14.1  PLT 294 296 263  CREATININE 2.88* 2.83* 2.45*   Estimated Creatinine Clearance: 19.6 mL/min (by C-G formula based on Cr of 2.45). No results for input(s): VANCOTROUGH, VANCOPEAK, VANCORANDOM, GENTTROUGH, GENTPEAK, GENTRANDOM, TOBRATROUGH, TOBRAPEAK, TOBRARND, AMIKACINPEAK, AMIKACINTROU, AMIKACIN in the last 72 hours.   Microbiology: Recent Results (from the past 720 hour(s))  Urine culture     Status: None   Collection Time: 03/07/15 10:01 PM  Result Value Ref Range Status   Specimen Description URINE, CATHETERIZED  Final   Special Requests Normal  Final   Colony Count NO GROWTH Performed at [facility]   Final   Culture NO GROWTH Performed at [facility]   Final   Report Status 03/09/2015 FINAL  Final  Clostridium Difficile by PCR     Status: None   Collection Time: 03/10/15  3:51 PM  Result Value Ref Range Status   C difficile by pcr NEGATIVE NEGATIVE Final    Comment: Performed at [facility]    Medications:  Anti-infectives    Start     Dose/Rate Route Frequency Ordered Stop   03/08/15 1300  piperacillin-tazobactam (ZOSYN) IVPB 2.25 g     2.25 g 100 mL/hr over 30 Minutes Intravenous 4 times per day 03/08/15 1241     03/08/15 0430  cefTRIAXone (ROCEPHIN) 2 g in dextrose 5 % 50  mL IVPB - Premix  Status:  Discontinued     2 g 100 mL/hr over 30 Minutes Intravenous Daily 03/08/15 0419 03/08/15 1232     Assessment: HPI: [age] y.o. male with known history of dementia, chronic atrial fibrillation, chronic diastolic CHF, hypothyroidism, chronic kidney disease was brought to the ER after patient had persistent nausea and vomiting. In the ER patient's creatinine is found to be elevated and baseline done 2 weeks ago was around 1.7. Now with suspected aspiration pneumonia.  4/2 >> ceftriaxone >> 4/2 4/2 >> Zosyn>>  Tmax: afeb WBCs: WNL Renal: SCr 2.45, improving (baseline SCr 1.7)  4/1 urine: NG 4/4 C diff: negative  Goal of Therapy:  Appropriate antibiotic dosing for renal function; eradication of infection.  Plan: Day #4 zosyn  Continue Zosyn 2.25g IV q6h   Follow ability to narrow antibiotics  Follow up renal fxn, culture results, and clinical course.  Juliette Alcideustin Zeigler, PharmD, BCPS.   Pager: [phone]  03/11/2015,11:21 AM.

## 2015-03-12 DIAGNOSIS — I482 Chronic atrial fibrillation: Secondary | ICD-10-CM

## 2015-03-12 DIAGNOSIS — R1314 Dysphagia, pharyngoesophageal phase: Secondary | ICD-10-CM

## 2015-03-12 LAB — BASIC METABOLIC PANEL
ANION GAP: 11 (ref 5–15)
BUN: 43 mg/dL — AB (ref 6–23)
CALCIUM: 9 mg/dL (ref 8.4–10.5)
CHLORIDE: 114 mmol/L — AB (ref 96–112)
CO2: 22 mmol/L (ref 19–32)
CREATININE: 2.51 mg/dL — AB (ref 0.50–1.35)
GFR calc Af Amer: 24 mL/min — ABNORMAL LOW (ref >90)
GFR calc non Af Amer: 21 mL/min — ABNORMAL LOW (ref >90)
Glucose, Bld: 103 mg/dL — ABNORMAL HIGH (ref 70–99)
Potassium: 3.2 mmol/L — ABNORMAL LOW (ref 3.5–5.1)
Sodium: 147 mmol/L — ABNORMAL HIGH (ref 135–145)

## 2015-03-12 LAB — CBC
HCT: 44.4 % (ref 39.0–52.0)
Hemoglobin: 14.6 g/dL (ref 13.0–17.0)
MCH: 28.7 pg (ref 26.0–34.0)
MCHC: 32.9 g/dL (ref 30.0–36.0)
MCV: 87.2 fL (ref 78.0–100.0)
PLATELETS: 264 10*3/uL (ref 150–400)
RBC: 5.09 MIL/uL (ref 4.22–5.81)
RDW: 15.1 % (ref 11.5–15.5)
WBC: 10 10*3/uL (ref 4.0–10.5)

## 2015-03-12 MED ORDER — DEXTROSE 5 % IV SOLN: INTRAVENOUS | Status: DC

## 2015-03-12 MED ORDER — POTASSIUM CL IN DEXTROSE 5% 20 MEQ/L IV SOLN
20.0000 meq | INTRAVENOUS | Status: DC
  Administered 2015-03-12 – 2015-03-13 (×2): 20 meq via INTRAVENOUS
  Filled 2015-03-12 (×3): qty 1000

## 2015-03-12 MED ORDER — ENOXAPARIN SODIUM 30 MG/0.3ML ~~LOC~~ SOLN
30.0000 mg | SUBCUTANEOUS | Status: DC
  Administered 2015-03-12: 30 mg via SUBCUTANEOUS
  Filled 2015-03-12 (×2): qty 0.3

## 2015-03-12 MED ORDER — METOCLOPRAMIDE HCL 5 MG/5ML PO SOLN
10.0000 mg | Freq: Three times a day (TID) | ORAL | Status: DC
  Administered 2015-03-13 (×2): 10 mg via ORAL
  Filled 2015-03-12 (×4): qty 10

## 2015-03-12 NOTE — Consult Note (Signed)
Patient John Wilkins      DOB: 09/01/1922      QJF:[id number]   Palliative Medicine Team at [facility] Progress Note    Subjective: Still confused today. Not complaining of pain or dyspnea. No nausea. On clears.  Met with sons, caregiver as below   Filed Vitals:   03/12/15 1202  BP: 128/82  Pulse: 86  Temp:   Resp:    Physical exam: GEN: alert, NAD CV: RRR LUNGS: CTAB ABD: soft, ND EXT: warm  CBC    Component Value Date/Time   WBC 10.0 03/12/2015 0529   RBC 5.09 03/12/2015 0529   HGB 14.6 03/12/2015 0529   HCT 44.4 03/12/2015 0529   PLT 264 03/12/2015 0529   MCV 87.2 03/12/2015 0529   MCH 28.7 03/12/2015 0529   MCHC 32.9 03/12/2015 0529   RDW 15.1 03/12/2015 0529   LYMPHSABS 1.6 03/08/2015 0400   MONOABS 1.0 03/08/2015 0400   EOSABS 0.2 03/08/2015 0400   BASOSABS 0.0 03/08/2015 0400    CMP     Component Value Date/Time   NA 147* 03/12/2015 0529   K 3.2* 03/12/2015 0529   CL 114* 03/12/2015 0529   CO2 22 03/12/2015 0529   GLUCOSE 103* 03/12/2015 0529   BUN 43* 03/12/2015 0529   CREATININE 2.51* 03/12/2015 0529   CALCIUM 9.0 03/12/2015 0529   PROT 6.7 03/08/2015 0400   ALBUMIN 2.9* 03/08/2015 0400   AST 14 03/08/2015 0400   ALT 9 03/08/2015 0400   ALKPHOS 97 03/08/2015 0400   BILITOT 0.9 03/08/2015 0400   GFRNONAA 21* 03/12/2015 0529   GFRAA 24* 03/12/2015 0529     Assessment and plan: 81 yo male with advanced dementia admitted with nausea/vomiting. Palliative consult for goals.   1. Code Status:  DNR after discussion today. I have filled out OOH DNR as well   2. GOC: Met with Richardson Landry and his brother Ramroop today as well as [place].  We talked extensively about swallowing issues at hand and likely neuromuscular etiology.  I think appropriately so, they were not interested in PEG tube. Challenging situation going forward as I don't think we have a good solution for this problem and really I think this is another sign of his end stage dementia.  We talked through some of these issues and challenges going forward and ultimately what they would like to do is take him home and focus on comfort care. They already have 24h caregivers in the home and we discussed hospice care as a way to ensure he can stay home as he declines and eventually above issues take his life.  Family in agreement with this as well as DNR. I will place CM consult to help them arrange home hospice. I think when this can be arranged and equipment setup, it would be reasonable to d/c him home and can even complete oral abx for aspiration PNA course.  Prognosis 6 months or less if disease follows expected course, and I suspect more likely in weeks to months range.   3. Symptom Management:  1. Acute Encephalopathy: resolving 2. Loss of Appetite/Dysphagia: remeron. Issue remains with his dysphagia issues.  I think we can try low dose remeron to see if pro-kinetic agents help. Would have low threshold to d/c if no improvement in symptoms.     4. 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 Has 2 sons.   Total Time: 40 minutes >50% of time spent in counseling and coordination of care regarding above.  Doran Clay D.O. Palliative Medicine Team at [facility]  Pager: [phone] Team Phone: [phone]

## 2015-03-12 NOTE — Progress Notes (Signed)
Notified by Dartha LodgeAndrea CM of family request for [facility] after discharge. Chart and patient information reviewed with Dr. Berlinda Last. [place] [facility] Medical Director.  Writer spoke with son, Ree KidaJack at the bedside to initiate education related to hospice philosophy, services and team approach to care. Son voiced understanding of the information provided. Per discussion plan [facility]  Please send signed and completed DNR form home with patient. Pt. will need prescriptions for discharge comfort medications.  DME  needs discussed and family requests an electric bed and a table for delivery in the morning. [facility] equipment manager Jewel Kizzie BaneHughes notified and will contact [facility] to arrange delivery to the home.Ree KidaJack is the contact person to be called to arrange time of delivery.  [facility] referral Center aware of above. [facility] information and contact numbers given to son, Ree KidaJack during visit.  Above information shared with [facility] CMRN.  Sharen HeckLisa Strandberg RN [facility] Hospital Liaison [phone]

## 2015-03-12 NOTE — Progress Notes (Signed)
CARE MANAGEMENT NOTE 03/12/2015  Patient:  John Wilkins,John Wilkins   Account Number:  [id number]  Date Initiated:  03/12/2015  Documentation initiated by:  Ferdinand CavaSCHETTINO,Nastasia Kage  Subjective/Objective Assessment:   81 yo male admitted with ARF, UTI, asp pna from home with 24/7 caregivers     Action/Plan:   discharge planning   Anticipated DC Date:  03/14/2015   Anticipated DC Plan:        DC Planning Services  CM consult      Choice offered to / List presented to:             Status of service:  In process, will continue to follow Medicare Important Message given?  YES (If response is "NO", the following Medicare IM given date fields will be blank) Date Medicare IM given:  03/12/2015 Medicare IM given by:  Ferdinand CavaSCHETTINO,Makana Feigel Date Additional Medicare IM given:   Additional Medicare IM given by:    Discharge Disposition:    Per UR Regulation:    If discussed at Long Length of Stay Meetings, dates discussed:    Comments:  03/12/15 Ferdinand CavaAndrea Schettino RN BSN CM [phone] Spoke with patient's Kelly Splintersn [name] and caregiver, [name], in the room and they stated that the patient will be returning home with his 24/7 caregivers in place. They stated that he already has a walker, wheelchair, BSC, shower chair, elevated toilet seat in the home and the only piece of DME needed is a hospital bed. [name] stated that the [facility] prior to this admit. He had RN, PT, OT, aide, ST with [facility]. [name] stated that [facility] services in the home. Explained that [facility] then hospital bed can be ordered through the hospital but if home hospice was desired than the chosen hospice agency will order any necessary DME. [name] stated the Palliative care MD is visiting today to continue GOC discussion. Will continue to follow for discharge planning needs.

## 2015-03-12 NOTE — Progress Notes (Signed)
[facility]                                                                              Patient Demographics  John Wilkins, is a 81 y.o. male, DOB - 07/31/1922, ZOX:[id number]  Admit date - 03/07/2015   Admitting Physician Eduard Clos, MD  Outpatient Primary MD for the patient is No primary care provider on file.  LOS - 4   Chief Complaint  Patient presents with  . Weakness  . Diarrhea  . Emesis       Brief HPI   81 y.o. male with known history of dementia, chronic atrial fibrillation, chronic diastolic CHF, hypothyroidism, chronic kidney disease was brought to the ER after patient had persistent nausea and vomiting. Patient has been having these symptoms ongoing for last 2-3 weeks and was also recently admitted at [facility].  In the ER patient's Cr found to be elevated and baseline done 2 weeks ago was around 1.7. CT abd and pelvis unremarkable. UA showed features consistent with UTI and chest x-ray shows possible aspiration. On exam patient was demented and was not able to provide history. Abdomen appeared benign on exam.   Major events since admission: 4/4 - more vomiting after liquids, DG Esophagus unremarkable, MBS (pt unable to swallow barium pill)     Assessment & Plan    Principal Problem:  Acute encephalopathy- appears to be multifactorial and secondary to acute UTI, ? Aspiration PNA, imposed on known dementia  - please note that ABX was changed to Zosyn to cover for aspiration PNA, 4/2, will continue Zosyn day 5 out of 7  - son not objected to SNF placement when pt ready    Active problems  Nausea and vomiting - DG esophagus with no clear etiology - MBS passes without delay but barium tablet unable to pass - per GI specialist, this is likely neuromuscular problem in the setting of advancing dementia and stroke. Son aware and does not want any PEG tube. Palliative medicine team following.   Renal failure (ARF), acute on  chronic, stage III - pre renal etiology imposed on CKD stage III - continuing IVF, Cr is trending down: 3.55 --> 2.5 today    Acute hypoxic respiratory failure secondary to aspiration PNA  - with oxygen saturations 87% on RA on admission, currently 96% on room air.  - treating aspiration PNA with Zosyn day 5 out of 7    Hypernatremia  -Continue D5 with potassium supplementation   Hypokalemia - supplement in IVF D5    Diastolic CHF, chronic - Currently compensated, hold Lasix   Dementia with functional quadriplegia  - continue PT while inpatient  - son agreeable to SNF - SW consulted for placement assistance    Hypothyroidism - continue synthroid, changed to IV for now    Severe PCM - in the context of dementia and now acute illness - SLP done and recommendation is to continue thin liquid diet as pt able to tolerate    Chronic atrial fib - rate controlled, currently on IV metoprolol until patient is able to tolerate oral diet better, - upon discharge, change to liquid  form of metoprolol     Code Status: Full code  Family Communication: No family member at the bedside  Disposition Plan: Pending skilled nursing facility  Time Spent in minutes   25 minutes  Procedures  none  Consults   GI Palliative  DVT Prophylaxis  Lovenox Medications  Scheduled Meds: . levothyroxine  66 mcg Intravenous Daily  . metoprolol  5 mg Intravenous 4 times per day  . mirtazapine  15 mg Oral QHS  . pantoprazole (PROTONIX) IV  40 mg Intravenous Daily  . piperacillin-tazobactam (ZOSYN)  IV  2.25 g Intravenous 4 times per day   Continuous Infusions:  PRN Meds:.acetaminophen **OR** acetaminophen, morphine injection, [DISCONTINUED] ondansetron **OR** ondansetron (ZOFRAN) IV   Antibiotics   Anti-infectives    Start     Dose/Rate Route Frequency Ordered Stop   03/08/15 1300  piperacillin-tazobactam (ZOSYN) IVPB 2.25 g     2.25 g 100 mL/hr over 30 Minutes Intravenous 4  times per day 03/08/15 1241     03/08/15 0430  cefTRIAXone (ROCEPHIN) 2 g in dextrose 5 % 50 mL IVPB - Premix  Status:  Discontinued     2 g 100 mL/hr over 30 Minutes Intravenous Daily 03/08/15 0419 03/08/15 1232        Subjective:   John Wilkins was seen and examined today. Unable to provide review of systems due to dementia, denies any pain, chest pain or shortness of breath, abdominal pain, nausea or vomiting.  No acute events overnight.    Objective:   Blood pressure 128/82, pulse 86, temperature 97.8 F (36.6 C), temperature source Axillary, resp. rate 22, height  (1.753 m), weight 71.759 kg (158 lb 3.2 oz), SpO2 96 %.  Wt Readings from Last 3 Encounters:  03/12/15 71.759 kg (158 lb 3.2 oz)     Intake/Output Summary (Last 24 hours) at 03/12/15 1226 Last data filed at 03/11/15 2345  Gross per 24 hour  Intake    160 ml  Output    100 ml  Net     60 ml    Exam  General: Alert and awake, oriented to self, NAD   HEENT:  PERRLA, EOMI, Anicteic Sclera, mucous membranes moist.   Neck: Supple, no JVD, no masses  CVS: S1 S2 auscultated, no rubs, murmurs or gallops. Regular rate and rhythm.  Respiratory: Clear to auscultation bilaterally, no wheezing, rales or rhonchi  Abdomen: Soft, nontender, nondistended, + bowel sounds  Ext: no cyanosis clubbing or edema  Neuro: AAOx3, Cr N's II- XII. moving all 4 extremities   Skin: No rashes  Psych: Somewhat confused, oriented to self    Data Review   Micro Results Recent Results (from the past 240 hour(s))  Urine culture     Status: None   Collection Time: 03/07/15 10:01 PM  Result Value Ref Range Status   Specimen Description URINE, CATHETERIZED  Final   Special Requests Normal  Final   Colony Count NO GROWTH Performed at [facility]   Final   Culture NO GROWTH Performed at [facility]   Final   Report Status 03/09/2015 FINAL  Final  Clostridium Difficile by PCR     Status: None    Collection Time: 03/10/15  3:51 PM  Result Value Ref Range Status   C difficile by pcr NEGATIVE NEGATIVE Final    Comment: Performed at [facility]    Radiology Reports Ct Abdomen Pelvis Wo Contrast  03/07/2015   CLINICAL DATA:  Abdominal pain of  6 weeks duration.  EXAM: CT ABDOMEN AND PELVIS WITHOUT CONTRAST  TECHNIQUE: Multidetector CT imaging of the abdomen and pelvis was performed following the standard protocol without IV contrast.  COMPARISON:  None.  FINDINGS: There are unremarkable unenhanced appearances of the liver and bile ducts. There is prior cholecystectomy.  The spleen, pancreas and adrenals appear unremarkable.  Multiple renal cysts are present bilaterally, measuring up to 9 cm on the right and 8 cm on the left. These are not conclusively characterized in the absence of intravenous contrast but are more likely benign. Both kidneys are negative for calculus or hydronephrosis. Both ureters appear unremarkable. Urinary bladder is decompressed but grossly unremarkable.  The abdominal aorta is normal in caliber with mild atherosclerotic calcification.  There are unremarkable appearances of the stomach, small bowel and colon.  No acute inflammatory changes are evident in the abdomen or pelvis. There is no ascites. There is no adenopathy.  Mild ground-glass opacities are present in the bases of both lungs, indeterminate with regard to infectious infiltrate versus atelectasis versus aspiration. There is a fat containing diaphragmatic hernia on the right posteriorly.  Multiple lower thoracic and lumbar vertebral compressions are present, more likely old. No discrete skeletal lesions are evident.  IMPRESSION: 1. No acute findings are evident in the abdomen or pelvis. 2. Multiple renal cysts bilaterally, indeterminate but more likely benign. 3. Mild airspace opacities in both lung bases, indeterminate with regard to infection versus atelectasis versus aspiration.   Electronically Signed   By:  Ellery Plunkaniel R Mitchell M.D.   On: 03/07/2015 23:11   Dg Chest 1 View  03/07/2015   CLINICAL DATA:  Weakness.  Diarrhea.  EXAM: CHEST  1 VIEW  COMPARISON:  None.  FINDINGS: The Chin overlies the apices. High riding humeral heads, consistent with chronic rotator cuff insufficiency. Mild cardiomegaly with a tortuous thoracic aorta. No right and no definite left pleural effusion. No pneumothorax. Mild right base scarring. Patchy left mid and lower lung airspace disease.  IMPRESSION: Cardiomegaly without congestive failure.  Patchy left-sided opacities which are of indeterminate acuity. Cannot exclude infection or aspiration. Consider PA and lateral radiographs if possible.   Electronically Signed   By: Jeronimo GreavesKyle  Talbot M.D.   On: 03/07/2015 21:50   Dg Esophagus  03/10/2015   CLINICAL DATA:  Initial encounter for difficulty swallowing.  EXAM: ESOPHOGRAM/BARIUM SWALLOW  TECHNIQUE: Single contrast examination was performed using  thin barium.  FLUOROSCOPY TIME:  Radiation Exposure Index (as provided by the fluoroscopic device):  If the device does not provide the exposure index:  Fluoroscopy Time:  1 minutes and 26 seconds.  Number of Acquired Images:  None.  COMPARISON:  None.  FINDINGS: The patient was immobile. As such, this study was performed with hand in a right-side-down decubitus position. B was given sips of thin barium which passed readily through the esophagus into the stomach. No gross or marked esophageal stricture or mass could be identified. No evidence for esophageal diverticulum. A small hiatal hernia is evident.  The patient was given a 13 mm barium tablet, but was unable to swallow the tablet.  IMPRESSION: Grossly unremarkable limited study of esophagus with free passage of barium from the mouth to the stomach. There is no esophageal obstruction. Subtle stricture could be missed because the patient was unable to swallow a barium tablet.  Small hiatal hernia.   Electronically Signed   By: Kennith CenterEric  Mansell M.D.    On: 03/10/2015 11:53    CBC  Recent Labs Lab 03/07/15  2122  03/08/15 0400 03/09/15 0435 03/10/15 0254 03/11/15 0528 03/12/15 0529  WBC 9.7  --  8.6 9.8 9.2 8.6 10.0  HGB 14.5  < > 14.5 14.4 14.4 14.1 14.6  HCT 43.1  < > 43.2 43.7 43.9 42.6 44.4  PLT 265  --  243 294 296 263 264  MCV 87.2  --  87.1 86.9 87.5 86.8 87.2  MCH 29.4  --  29.2 28.6 28.7 28.7 28.7  MCHC 33.6  --  33.6 33.0 32.8 33.1 32.9  RDW 14.8  --  14.8 14.8 14.9 14.9 15.1  LYMPHSABS 1.9  --  1.6  --   --   --   --   MONOABS 0.9  --  1.0  --   --   --   --   EOSABS 0.2  --  0.2  --   --   --   --   BASOSABS 0.0  --  0.0  --   --   --   --   < > = values in this interval not displayed.  Chemistries   Recent Labs Lab 03/07/15 2122  03/08/15 0400 03/09/15 0435 03/10/15 0254 03/11/15 0528 03/12/15 0529  NA 145  < > 144 148* 148* 146* 147*  K 3.4*  < > 3.6 4.1 3.6 3.0* 3.2*  CL 109  < > 104 113* 113* 113* 114*  CO2 21  --  19 17* GLUCOSE 88  < > 67* 93 126* 111* 103*  BUN 72*  < > 67* 56* 52* 42* 43*  CREATININE 3.55*  < > 3.49* 2.88* 2.83* 2.45* 2.51*  CALCIUM 8.7  --  8.4 9.0 8.9 8.9 9.0  MG  --   --   --   --  2.3  --   --   AST 13  --  14  --   --   --   --   ALT 9  --  9  --   --   --   --   ALKPHOS 103  --  97  --   --   --   --   BILITOT 0.7  --  0.9  --   --   --   --   < > = values in this interval not displayed. ------------------------------------------------------------------------------------------------------------------ estimated creatinine clearance is 19.2 mL/min (by C-G formula based on Cr of 2.51). ------------------------------------------------------------------------------------------------------------------ No results for input(s): HGBA1C in the last 72 hours. ------------------------------------------------------------------------------------------------------------------ No results for input(s): CHOL, HDL, LDLCALC, TRIG, CHOLHDL, LDLDIRECT in the last 72  hours. ------------------------------------------------------------------------------------------------------------------ No results for input(s): TSH, T4TOTAL, T3FREE, THYROIDAB in the last 72 hours.  Invalid input(s): FREET3 ------------------------------------------------------------------------------------------------------------------ No results for input(s): VITAMINB12, FOLATE, FERRITIN, TIBC, IRON, RETICCTPCT in the last 72 hours.  Coagulation profile No results for input(s): INR, PROTIME in the last 168 hours.  No results for input(s): DDIMER in the last 72 hours.  Cardiac Enzymes No results for input(s): CKMB, TROPONINI, MYOGLOBIN in the last 168 hours.  Invalid input(s): CK ------------------------------------------------------------------------------------------------------------------ Invalid input(s): POCBNP  No results for input(s): GLUCAP in the last 72 hours.   Dixie Coppa M.D. [facility] 03/12/2015, 12:26 PM  Pager: [phone]   Between 7am to 7pm - call Pager - [phone]  After 7pm go to [email or web address] - password [email or web address]  Call night coverage person covering after 7pm

## 2015-03-12 NOTE — Progress Notes (Signed)
CARE MANAGEMENT NOTE 03/12/2015  Patient:  Shanda HowellsBLAYLOCK,Amarri   Account Number:  [id number]  Date Initiated:  03/12/2015  Documentation initiated by:  Ferdinand CavaSCHETTINO,Zadia Uhde  Subjective/Objective Assessment:   81 yo male admitted with ARF, UTI, asp pna from home with 24/7 caregivers     Action/Plan:   discharge planning   Anticipated DC Date:  03/14/2015   Anticipated DC Plan:  HOME W HOSPICE CARE      DC Planning Services  CM consult      PAC Choice  HOSPICE   Choice offered to / List presented to:  C-4 Adult Children           HH agency  [facility] [place]   Status of service:  In process, will continue to follow Medicare Important Message given?  YES (If response is "NO", the following Medicare IM given date fields will be blank) Date Medicare IM given:  03/12/2015 Medicare IM given by:  Ferdinand CavaSCHETTINO,Trayden Brandy Date Additional Medicare IM given:   Additional Medicare IM given by:    Discharge Disposition:    Per UR Regulation:    If discussed at Long Length of Stay Meetings, dates discussed:    Comments:  03/12/15 Ferdinand CavaAndrea Schettino RN BSN CM [phone] Received referral for home hospice choice. Spoke with patient sons Ree KidaJack and [name] in the room and they stated that they want the patient to return home with 24/7 caregivers and home hospice. Offered choice and chose [facility]. Referral called [facility] [phone] and she stated that Misty StanleyLisa will follow up on assessment of patient and speak with family. Olegario MessierKathy stated that this CM will receive follow up call with ETA of Lisa. [facility], Dr. Janece CanterburyAaron Boals.  03/12/15 Ferdinand CavaAndrea Schettino RN BSN CM [phone] Spoke with patient's Kelly Splintersn [name] and caregiver, [name], in the room and they stated that the patient will be returning home with his 24/7 caregivers in place. They stated that he already has a walker, wheelchair, BSC, shower chair, elevated toilet seat in the home and the only piece of DME needed is a  hospital bed. [name] stated that [facility] to this admit. He had RN, PT, OT, aide, ST with [facility]. [name] stated that [facility] home. Explained that [facility] then hospital bed can be ordered through the hospital but if home hospice was desired than the chosen hospice agency will order any necessary DME. [name] stated the Palliative care MD is visiting today to continue GOC discussion. Will continue to follow for discharge planning needs.

## 2015-03-12 NOTE — Progress Notes (Signed)
Occupational Therapy Treatment Patient Details Name: John HowellsJack Lingelbach MRN: [id number] DOB: [birth year] Today's Date: 03/12/2015    History of present illness 81 y.o. male with known history of dementia, chronic atrial fibrillation, chronic diastolic CHF, hypothyroidism, chronic kidney disease was brought to the ER after patient had persistent nausea and vomiting, acute renal failure.       Follow Up Recommendations  Supervision/Assistance - 24 hour;Home health OT;SNF;Other (comment) (depending on family abilty to A)          Precautions / Restrictions Precautions Precautions: Fall Restrictions Weight Bearing Restrictions: No       Mobility Bed Mobility Overal bed mobility: Needs Assistance Bed Mobility: Supine to Sit   Sidelying to sit: Max assist          Transfers                      Balance     Sitting balance-Leahy Scale: Fair Sitting balance - Comments: Initally required Mod assist to maintain balance. Pt then progressed to sitting EOB with Min guard assist for several minutes.      Standing balance-Leahy Scale: Poor                     ADL       Grooming: Wash/dry face;Sitting;Minimal assistance                                 General ADL Comments: pt sat EOB for approx 30 minutes focusing on balance.  Pt doing well this day. 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  Pt did not want to get in chair or return to supine- family present and left with pt sitting EOB. Pts sons and caregiver present and RN aware                Cognition                                     General Comments DC home may be an option if caregivers available.  Noted palliative involved         Frequency       Progress Toward Goals  OT Goals(current goals can now be found in the care plan section)  Progress towards OT goals: Progressing toward goals     Plan Discharge plan remains  appropriate;Discharge plan needs to be updated    Co-evaluation                 End of Session     Activity Tolerance Patient limited by fatigue   Patient Left in bed   Nurse Communication Mobility status        Time: [id number] OT Time Calculation (min): 38 min  Charges: OT General Charges $OT Visit: 1 Procedure OT Treatments $Self Care/Home Management : 38-52 mins  Caryl Fate D 03/12/2015, 11:11 AM

## 2015-03-13 DIAGNOSIS — E039 Hypothyroidism, unspecified: Secondary | ICD-10-CM

## 2015-03-13 DIAGNOSIS — R531 Weakness: Secondary | ICD-10-CM

## 2015-03-13 MED ORDER — LORAZEPAM 1 MG PO TABS: 1.0000 mg | ORAL_TABLET | Freq: Four times a day (QID) | ORAL | Status: AC | PRN

## 2015-03-13 MED ORDER — AMOXICILLIN-POT CLAVULANATE 875-125 MG PO TABS: 1.0000 | ORAL_TABLET | Freq: Two times a day (BID) | ORAL | Status: AC

## 2015-03-13 MED ORDER — AMOXICILLIN-POT CLAVULANATE 875-125 MG PO TABS: 1.0000 | ORAL_TABLET | Freq: Two times a day (BID) | ORAL | Status: DC

## 2015-03-13 MED ORDER — MORPHINE SULFATE (CONCENTRATE) 10 MG/0.5ML PO SOLN: 5.0000 mg | ORAL | Status: AC | PRN

## 2015-03-13 MED ORDER — POTASSIUM CHLORIDE 20 MEQ PO PACK: 40.0000 meq | PACK | Freq: Once | ORAL | Status: DC

## 2015-03-13 MED ORDER — LORAZEPAM 1 MG PO TABS: 1.0000 mg | ORAL_TABLET | Freq: Four times a day (QID) | ORAL | Status: DC | PRN

## 2015-03-13 MED ORDER — MIRTAZAPINE 15 MG PO TBDP: 15.0000 mg | ORAL_TABLET | Freq: Every day | ORAL | Status: AC

## 2015-03-13 MED ORDER — POTASSIUM CHLORIDE CRYS ER 20 MEQ PO TBCR
40.0000 meq | EXTENDED_RELEASE_TABLET | Freq: Once | ORAL | Status: AC
  Administered 2015-03-13: 40 meq via ORAL
  Filled 2015-03-13: qty 2

## 2015-03-13 MED ORDER — MORPHINE SULFATE (CONCENTRATE) 10 MG/0.5ML PO SOLN
5.0000 mg | ORAL | Status: DC | PRN
  Administered 2015-03-13: 5 mg via ORAL
  Filled 2015-03-13: qty 0.5

## 2015-03-13 MED ORDER — METOCLOPRAMIDE HCL 5 MG/5ML PO SOLN: 10.0000 mg | Freq: Three times a day (TID) | ORAL | Status: AC

## 2015-03-13 NOTE — Progress Notes (Signed)
CSW consulted for transportation needs. Patient will need non-emergency ambulance transport home. CSW confirmed home address [place] patient[place] caregiver, [name]   PTAR will be called for transport once hospital bed has been delivered - caregiver to call when ready.  No other CSW needs identified - CSW signing off.   Lincoln MaxinKelly Fedra Lanter, LCSW [facility] Clinical Social Worker cell #: [phone]

## 2015-03-13 NOTE — Discharge Summary (Signed)
Physician Discharge Summary   Patient ID: John Wilkins MRN: [id number] DOB/AGE: Jan 28, 1922 81 y.o.  Admit date: 03/07/2015 Discharge date: 03/13/2015  Primary Care Physician:  No primary care provider on file.  Discharge Diagnoses:    Acute encephalopathy   Acute hypoxic respiratory failure due to aspiration pneumonia   Dysphagia   Hypernatremia  . Renal failure (ARF), acute on chronic . Nausea & vomiting . Diastolic CHF, chronic . Dementia with failure to thrive, severe protein calorie malnutrition  . Hypothyroidism . Chronic atrial fibrillation   DO NOT RESUSCITATE   Consults:   Gastroenterology, Dr. Juanda Chance Palliative medicine, Dr Greig Right   Recommendations for Outpatient Follow-up:   Per palliative medicine assistance, patient to be discharged with home hospice.   TESTS THAT NEED FOLLOW-UP None DIET: Clear liquid diet/comfort fods    Allergies:  No Known Allergies   Discharge Medications:   Medication List    STOP taking these medications        aspirin EC 81 MG tablet     diltiazem 180 MG 24 hr capsule  Commonly known as:  DILACOR XR     donepezil 10 MG tablet  Commonly known as:  ARICEPT     furosemide 20 MG tablet  Commonly known as:  LASIX     megestrol 40 MG tablet  Commonly known as:  MEGACE     pravastatin 20 MG tablet  Commonly known as:  PRAVACHOL     traMADol 50 MG tablet  Commonly known as:  ULTRAM      TAKE these medications        acetaminophen 500 MG tablet  Commonly known as:  TYLENOL  Take 500 mg by mouth every 6 (six) hours as needed for mild pain.     amoxicillin-clavulanate 875-125 MG per tablet  Commonly known as:  AUGMENTIN  Take 1 tablet by mouth 2 (two) times daily. X 3 more days     levothyroxine 112 MCG tablet  Commonly known as:  SYNTHROID, LEVOTHROID  Take 112 mcg by mouth daily before breakfast.     LORazepam 1 MG tablet  Commonly known as:  ATIVAN  Take 1 tablet (1 mg total) by mouth every 6 (six) hours  as needed for anxiety.     metoCLOPramide 5 MG/5ML solution  Commonly known as:  REGLAN  Take 10 mLs (10 mg total) by mouth 3 (three) times daily before meals.     metoprolol tartrate 25 MG tablet  Commonly known as:  LOPRESSOR  Take 25 mg by mouth 2 (two) times daily.     mirtazapine 15 MG disintegrating tablet  Commonly known as:  REMERON SOL-TAB  Take 1 tablet (15 mg total) by mouth at bedtime.     morphine CONCENTRATE 10 MG/0.5ML Soln concentrated solution  Take 0.25 mLs (5 mg total) by mouth every 2 (two) hours as needed for moderate pain, severe pain or shortness of breath.     pantoprazole 40 MG tablet  Commonly known as:  PROTONIX  Take 40 mg by mouth daily.         Brief H and P: For complete details please refer to admission H and P, but in brief 81 y.o. male with known history of dementia, chronic atrial fibrillation, chronic diastolic CHF, hypothyroidism, chronic kidney disease was brought to the ER after patient had persistent nausea and vomiting. Patient has been having these symptoms ongoing for last 2-3 weeks and was also recently admitted at [facility].  In the ER  patient's Cr found to be elevated and baseline done 2 weeks ago was around 1.7. CT abd and pelvis unremarkable. UA showed features consistent with UTI and chest x-ray shows possible aspiration. On exam patient was demented and was not able to provide history. Abdomen appeared benign on exam.   Hospital Course:   Acute encephalopathy- appears to be multifactorial and secondary to acute UTI, ? Aspiration PNA, imposed on known dementia. Patient was placed on IV Zosyn to cover for aspiration pneumonia, transitioned to oral Augmentin at the time of discharge, would need only 3 more days to finish the full course. Palliative medicine was consulted, had extensive discussion with patient's family, at this time, family requested to take him home and focus on comfort care. They already have 24-hour caregivers.  Home hospice was arranged.   Nausea and vomiting - DG esophagus with no clear etiology. MBS passes without delay but barium tablet unable to pass. I asked radiology was consulted and felt this was more of a neuromuscular problem in the setting of advanced dementia and stroke. Patient's family was aware and did not want any feeding tube or PEG tube. Palliative medicine was also consulted. Per family's request, focus on comfort care.   Renal failure (ARF), acute on chronic, stage III - pre renal etiology imposed on CKD stage III, he was placed on IV fluids, creatinine 2.5 prior to discharge on forced Lasix. Lasix has been discontinued   Acute hypoxic respiratory failure secondary to aspiration PNA  - with oxygen saturations 87% on RA on admission, currently 96% on room air. The patient was placed on IV Zosyn to cover for aspiration pneumonia, transitioned to oral Augmentin at the time of discharge.   Hypernatremia  Patient was placed on D5 IV fluids with potassium supplementation.   Diastolic CHF, chronic - Currently compensated, hold Lasix   Dementia with failure to thrive Please see #1   Hypothyroidism Continue Synthroid   Severe PCM - in the context of dementia and now acute illness   Chronic atrial fib - rate controlled  Day of Discharge BP 132/86 mmHg  Pulse 73  Temp(Src) 97.3 F (36.3 C) (Axillary)  Resp 26  Ht 5\' 9"  (1.753 m)  Wt 72.077 kg (158 lb 14.4 oz)  BMI 23.45 kg/m2  SpO2 96%  Physical Exam: General: Alert and awake oriented to self not in any acute distress. HEENT: anicteric sclera, pupils reactive to light and accommodation CVS: S1-S2 clear no murmur rubs or gallops Chest: clear to auscultation bilaterally, no wheezing rales or rhonchi Abdomen: soft nontender, nondistended, normal bowel sounds Extremities: no cyanosis, clubbing or edema noted bilaterally    The results of significant diagnostics from this hospitalization (including imaging,  microbiology, ancillary and laboratory) are listed below for reference.    LAB RESULTS: Basic Metabolic Panel:  Recent Labs Lab 03/10/15 0254 03/11/15 0528 03/12/15 0529  NA 148* 146* 147*  K 3.6 3.0* 3.2*  CL 113* 113* 114*  CO2 22 23 22   GLUCOSE 126* 111* 103*  BUN 52* 42* 43*  CREATININE 2.83* 2.45* 2.51*  CALCIUM 8.9 8.9 9.0  MG 2.3  --   --    Liver Function Tests:  Recent Labs Lab 03/07/15 2122 03/08/15 0400  AST 13 14  ALT 9 9  ALKPHOS 103 97  BILITOT 0.7 0.9  PROT 7.2 6.7  ALBUMIN 3.1* 2.9*    Recent Labs Lab 03/07/15 2122  LIPASE 28   No results for input(s): AMMONIA in the last 168 hours.  CBC:  Recent Labs Lab 03/08/15 0400  03/11/15 0528 03/12/15 0529  WBC 8.6  < > 8.6 10.0  NEUTROABS 5.8  --   --   --   HGB 14.5  < > 14.1 14.6  HCT 43.2  < > 42.6 44.4  MCV 87.1  < > 86.8 87.2  PLT 243  < > 263 264  < > = values in this interval not displayed. Cardiac Enzymes: No results for input(s): CKTOTAL, CKMB, CKMBINDEX, TROPONINI in the last 168 hours. BNP: Invalid input(s): POCBNP CBG: No results for input(s): GLUCAP in the last 168 hours.  Significant Diagnostic Studies:  Ct Abdomen Pelvis Wo Contrast  03/07/2015   CLINICAL DATA:  Abdominal pain of 6 weeks duration.  EXAM: CT ABDOMEN AND PELVIS WITHOUT CONTRAST  TECHNIQUE: Multidetector CT imaging of the abdomen and pelvis was performed following the standard protocol without IV contrast.  COMPARISON:  None.  FINDINGS: There are unremarkable unenhanced appearances of the liver and bile ducts. There is prior cholecystectomy.  The spleen, pancreas and adrenals appear unremarkable.  Multiple renal cysts are present bilaterally, measuring up to 9 cm on the right and 8 cm on the left. These are not conclusively characterized in the absence of intravenous contrast but are more likely benign. Both kidneys are negative for calculus or hydronephrosis. Both ureters appear unremarkable. Urinary bladder is  decompressed but grossly unremarkable.  The abdominal aorta is normal in caliber with mild atherosclerotic calcification.  There are unremarkable appearances of the stomach, small bowel and colon.  No acute inflammatory changes are evident in the abdomen or pelvis. There is no ascites. There is no adenopathy.  Mild ground-glass opacities are present in the bases of both lungs, indeterminate with regard to infectious infiltrate versus atelectasis versus aspiration. There is a fat containing diaphragmatic hernia on the right posteriorly.  Multiple lower thoracic and lumbar vertebral compressions are present, more likely old. No discrete skeletal lesions are evident.  IMPRESSION: 1. No acute findings are evident in the abdomen or pelvis. 2. Multiple renal cysts bilaterally, indeterminate but more likely benign. 3. Mild airspace opacities in both lung bases, indeterminate with regard to infection versus atelectasis versus aspiration.   Electronically Signed   By: Ellery Plunk M.D.   On: 03/07/2015 23:11   Dg Chest 1 View  03/07/2015   CLINICAL DATA:  Weakness.  Diarrhea.  EXAM: CHEST  1 VIEW  COMPARISON:  None.  FINDINGS: The Chin overlies the apices. High riding humeral heads, consistent with chronic rotator cuff insufficiency. Mild cardiomegaly with a tortuous thoracic aorta. No right and no definite left pleural effusion. No pneumothorax. Mild right base scarring. Patchy left mid and lower lung airspace disease.  IMPRESSION: Cardiomegaly without congestive failure.  Patchy left-sided opacities which are of indeterminate acuity. Cannot exclude infection or aspiration. Consider PA and lateral radiographs if possible.   Electronically Signed   By: Jeronimo Greaves M.D.   On: 03/07/2015 21:50    2D ECHO:   Disposition and Follow-up: Discharge Instructions    Discharge instructions    Complete by:  As directed   Discharge diet: clear liquids or comfort food     Increase activity slowly    Complete by:  As  directed             DISPOSITION: Home with hospice   DISCHARGE FOLLOW-UP Follow-up Information    Schedule an appointment as soon as possible for a visit in 2 weeks to follow up.  Why:  As needed, If symptoms worsen   Contact information:   please follow-up with your primary physician        Time spent on Discharge: 35 minutes  Signed:   Jaclyn Carew M.D. [facility] 03/13/2015, 11:19 AM Pager: [phone]

## 2015-03-13 NOTE — Progress Notes (Signed)
Discharge summary and packet given to PTAR.  Family was not present upon discharge, unable to sign paperwork.  Called [place] (pt caregiver) and gave report.  Explained that all medications given were on the discharge summary.  Made her aware of time of last medication given.  All questions answered.  Gave her my work number to call if any other questions should arise.  Pt wheeled out on stretcher by PTAR.

## 2015-03-13 NOTE — Progress Notes (Signed)
CARE MANAGEMENT NOTE 03/13/2015  Patient:  John Wilkins,John Wilkins   Account Number:  [id number]  Date Initiated:  03/12/2015  Documentation initiated by:  Ferdinand CavaSCHETTINO,Marsi Turvey  Subjective/Objective Assessment:   81 yo male admitted with ARF, UTI, asp pna from home with 24/7 caregivers     Action/Plan:   discharge planning   Anticipated DC Date:  03/14/2015   Anticipated DC Plan:  HOME W HOSPICE CARE      DC Planning Services  CM consult      PAC Choice  HOSPICE   Choice offered to / List presented to:  C-4 Adult Children           HH agency  [facility] [place]   Status of service:  Completed, signed off Medicare Important Message given?  YES (If response is "NO", the following Medicare IM given date fields will be blank) Date Medicare IM given:  03/12/2015 Medicare IM given by:  Ferdinand CavaSCHETTINO,Larene Ascencio Date Additional Medicare IM given:   Additional Medicare IM given by:    Discharge Disposition:  HOME W HOSPICE CARE  Per UR Regulation:    If discussed at Long Length of Stay Meetings, dates discussed:    Comments:  03/13/15 Ferdinand CavaAndrea Schettino RN BSN CM [phone] Confirmed with Sharene SkeansLisa, [facility], liaison that [facility]. Hospital bed still needs to be delivered. CSW spoke with caregiver and will arrange PTAR once hosp bed delivered. Staff RN aware.  03/12/15 Ferdinand CavaAndrea Schettino RN BSN CM [phone] Received referral for home hospice choice. Spoke with patient sons Ree KidaJack and [name] in the room and they stated that they want the patient to return home with 24/7 caregivers and home hospice. Offered choice and chose [facility]. Referral called [facility]athy at [facility] [phone] and she stated that Misty StanleyLisa will follow up on assessment of patient and speak with family. Olegario MessierKathy stated that this CM will receive follow up call with ETA of Lisa. [facility], Dr. Janece CanterburyAaron Boals.  03/12/15 Ferdinand CavaAndrea Schettino RN BSN CM [phone] Spoke with patient's Kelly Splintersn [name] and caregiver,  [name], in the room and they stated that the patient will be returning home with his 24/7 caregivers in place. They stated that he already has a walker, wheelchair, BSC, shower chair, elevated toilet seat in the home and the only piece of DME needed is a hospital bed. [name] stated that the [facility] prior to this admit. He had RN, PT, OT, aide, ST with [facility]. [name] stated that they were trying to decide [facility] or hospice services in the home. Explained that [facility] then hospital bed can be ordered through the hospital but if home hospice was desired than the chosen hospice agency will order any necessary DME. [name] stated the Palliative care MD is visiting today to continue GOC discussion. Will continue to follow for discharge planning needs.

## 2015-03-13 NOTE — Progress Notes (Signed)
Patient John Wilkins:VWUJ:Anthem Lottie RaterBlaylock      DOB: [birth year]      WJX:[id number]   [facility]  Subjective:  Patient is OOB to chair, weak.  Confused and unable to verbalize needs.  Son at bedside, plan is to discharge home with hospice services today   Filed Vitals:   03/13/15 0425  BP: 132/86  Pulse: 73  Temp: 97.3 F (36.3 C)  Resp: 26   Physical exam: GEN: weak and confused, NAD CV: RRR LUNGS: CTAB ABD: soft, ND EXT: warm, trace edema  CBC    Component Value Date/Time   WBC 10.0 03/12/2015 0529   RBC 5.09 03/12/2015 0529   HGB 14.6 03/12/2015 0529   HCT 44.4 03/12/2015 0529   PLT 264 03/12/2015 0529   MCV 87.2 03/12/2015 0529   MCH 28.7 03/12/2015 0529   MCHC 32.9 03/12/2015 0529   RDW 15.1 03/12/2015 0529   LYMPHSABS 1.6 03/08/2015 0400   MONOABS 1.0 03/08/2015 0400   EOSABS 0.2 03/08/2015 0400   BASOSABS 0.0 03/08/2015 0400    CMP     Component Value Date/Time   NA 147* 03/12/2015 0529   K 3.2* 03/12/2015 0529   CL 114* 03/12/2015 0529   CO2 22 03/12/2015 0529   GLUCOSE 103* 03/12/2015 0529   BUN 43* 03/12/2015 0529   CREATININE 2.51* 03/12/2015 0529   CALCIUM 9.0 03/12/2015 0529   PROT 6.7 03/08/2015 0400   ALBUMIN 2.9* 03/08/2015 0400   AST 14 03/08/2015 0400   ALT 9 03/08/2015 0400   ALKPHOS 97 03/08/2015 0400   BILITOT 0.9 03/08/2015 0400   GFRNONAA 21* 03/12/2015 0529   GFRAA 24* 03/12/2015 0529     Assessment and plan: 81 yo male with advanced dementia admitted with nausea/vomiting. Palliative consult for goals.   1. Code Status:  DNR    2. GOC:  Hope is for comfort, quality and dignity.  Home today with hospice and 24/7 caregivers.   Prognosis 6 months or less if disease follows expected course. Discussed natural trajectory and expectations at EOL.  3. Symptom Management:  Dysphagia: Comfort feeds as tolerated with known risk of aspiration Generalized pain: Roxanol 5 mg po/sl every 3  hrs prn  4. Psychosocial/Spiritual: Former [employer] Vet. [place]ed [employer]. Has 2 sons. Family understand the limited prognosis.  Total Time: 35 minutes >50% of time spent in counseling and coordination of care regarding above.    Discussed with the attending physician  Lorinda CreedMary Larach NP  Palliative Medicine Team Team Phone # [phone] Pager [phone]  Discussed with Santo HeldRMary Larach NP  Palliative Medicine Team Team Phone # [phone] Pager [phone]

## 2015-03-13 NOTE — Progress Notes (Signed)
Wasted .5ml of morphine concentrate with jennifer huff.  Pt removed from pyxis after discharge and unable to waste.

## 2015-03-13 NOTE — Progress Notes (Signed)
PTAR called for transport. RN, Katie & caregiver, [name] aware.     Lincoln MaxinKelly Channah Godeaux, LCSW [facility] Clinical Social Worker cell #: [phone]

## 2015-03-13 NOTE — Progress Notes (Signed)
Physical Therapy Treatment Patient Details Name: Shanda HowellsJack Corpuz MRN: [id number] DOB: 09/15/1922 Today's Date: 03/13/2015    History of Present Illness 81 y.o. male with known history of dementia, chronic atrial fibrillation, chronic diastolic CHF, hypothyroidism, chronic kidney disease was brought to the ER after patient had persistent nausea and vomiting, acute renal failure.     PT Comments    Assisted with side to side rolling for hygiene due to loose greenish BM.  Assisted from supine to EOB then used EVA walker to transfer pt from elevated bed to recliner. Positioned in recliner with multiple pillows.    Follow Up Recommendations  Other (comment) (hospice)     Equipment Recommendations       Recommendations for Other Services       Precautions / Restrictions Precautions Precautions: Fall Precaution Comments: B hip and knee flex contractures Restrictions Weight Bearing Restrictions: No    Mobility  Bed Mobility Overal bed mobility: Needs Assistance Bed Mobility: Rolling;Supine to Sit Rolling: +2 for physical assistance;Total assist     Sit to supine: Total assist;+2 for physical assistance   General bed mobility comments: repeat functional cueing and increased time to complete task.  required extra asssit with scooting to EOB.  Once EOB pt was able to stactic sit at Supervision level.  Poor forward flex posture.    Transfers Overall transfer level: Needs assistance Equipment used: Bilateral platform walker (EVA walker) Transfers: Sit to/from Stand Sit to Stand: +2 safety/equipment;+2 physical assistance;Total assist         General transfer comment: used EVA walker for increased support.  Pt was able to partially rise and take a few side pivoted steps from elevated bed to recliner   B flex hips and knees with forward flex posture.    Ambulation/Gait             General Gait Details: transfers only   Stairs            Wheelchair Mobility     Modified Rankin (Stroke Patients Only)       Balance                                    Cognition Arousal/Alertness: Awake/alert Behavior During Therapy: Flat affect Overall Cognitive Status: Within Functional Limits for tasks assessed                      Exercises      General Comments        Pertinent Vitals/Pain Pain Assessment: No/denies pain    Home Living                      Prior Function            PT Goals (current goals can now be found in the care plan section) Progress towards PT goals: Progressing toward goals    Frequency  Min 3X/week    PT Plan      Co-evaluation             End of Session Equipment Utilized During Treatment: Gait belt Activity Tolerance: Patient tolerated treatment well Patient left: in chair;with family/visitor present;with call bell/phone within reach     Time: 0915-0945 PT Time Calculation (min) (ACUTE ONLY): 30 min  Charges:  $Therapeutic Activity: 23-37 mins  G Codes:      Rica Koyanagi  PTA [facility]  Acute  Rehab Pager      [phone]

## 2015-03-13 NOTE — Progress Notes (Signed)
Patient awake and alert this morning. He answers appropriately to questions. He denies any pain. [facility] to deliver equipment for d/c this morning to the home. Pt. will transport home by PTAR. Pt. Is eligible for Hospice services.  Please call with any concerns [phone].  Sharen HeckLisa Strandberg RN [facility] Hospital Liaison

## 2015-03-17 DIAGNOSIS — R531 Weakness: Secondary | ICD-10-CM | POA: Insufficient documentation

## 2015-04-06 DEATH — deceased

## 2016-02-29 IMAGING — RF DG ESOPHAGUS
14 of 24 series · 14 of 24 positions shown · non-contrast
Comparison: None.

CLINICAL DATA: Initial encounter for difficulty swallowing.

EXAM:
ESOPHOGRAM/BARIUM SWALLOW
TECHNIQUE: Single contrast examination was performed using  thin barium..
FLUOROSCOPY TIME:  Radiation Exposure Index (as provided by the
fluoroscopic device):
If the device does not provide the exposure index:
Fluoroscopy Time:  1 minutes and 26 seconds.
Number of Acquired Images:  None.

[Series 1: run · 1 of 1 slices shown (1 of 14)]
[im 1/1]
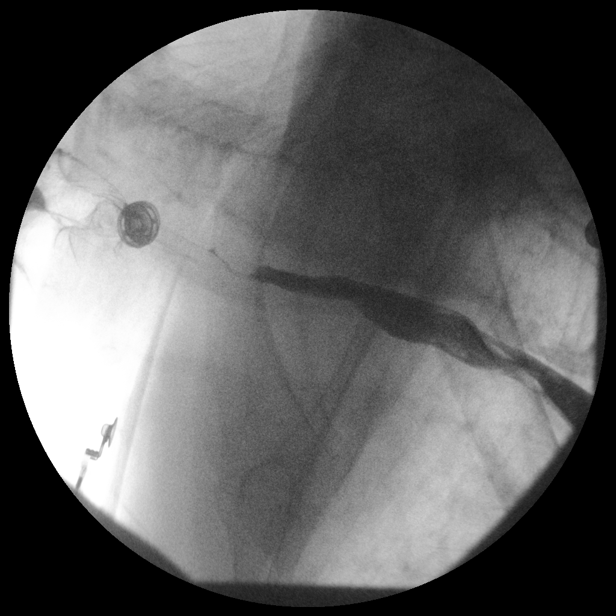

[Series 3: run · 1 of 1 slices shown (2 of 14)]
[im 1/1]
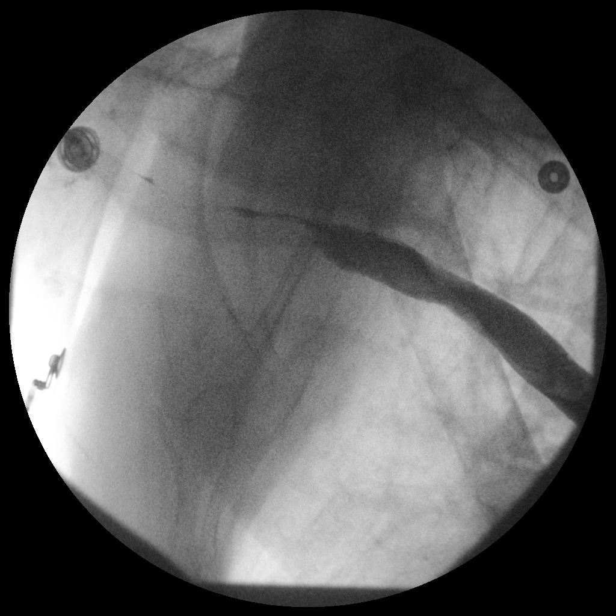

[Series 5: run · 1 of 1 slices shown (3 of 14)]
[im 1/1]
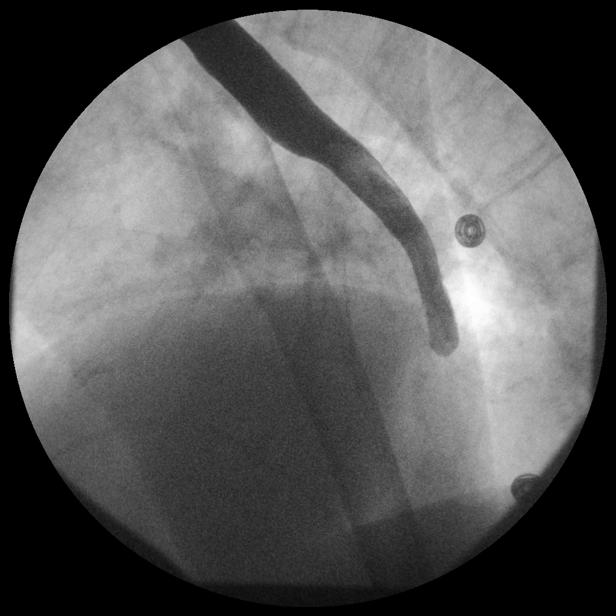

[Series 7: run · 1 of 1 slices shown (4 of 14)]
[im 1/1]
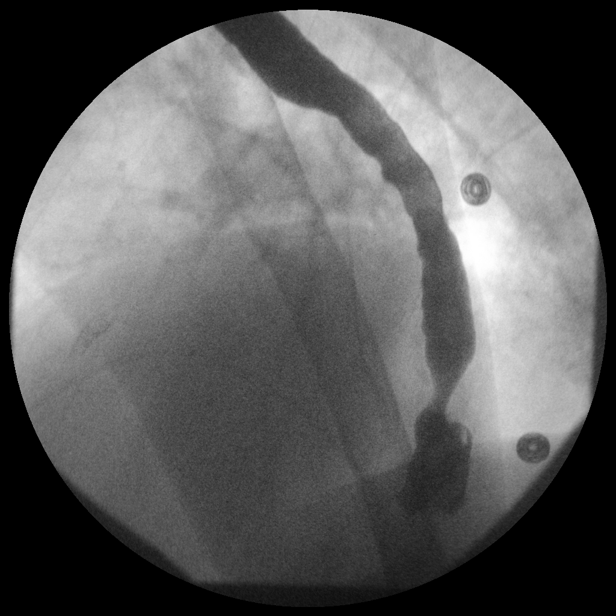

[Series 8: run · 1 of 1 slices shown (5 of 14)]
[im 1/1]
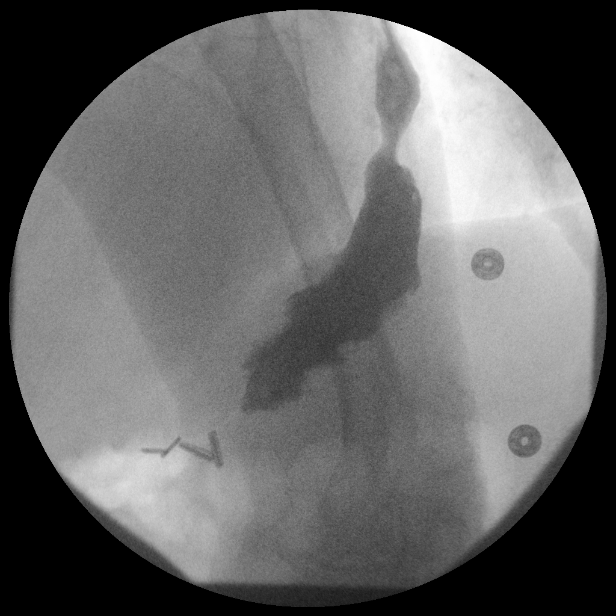

[Series 10: run · 1 of 1 slices shown (6 of 14)]
[im 1/1]
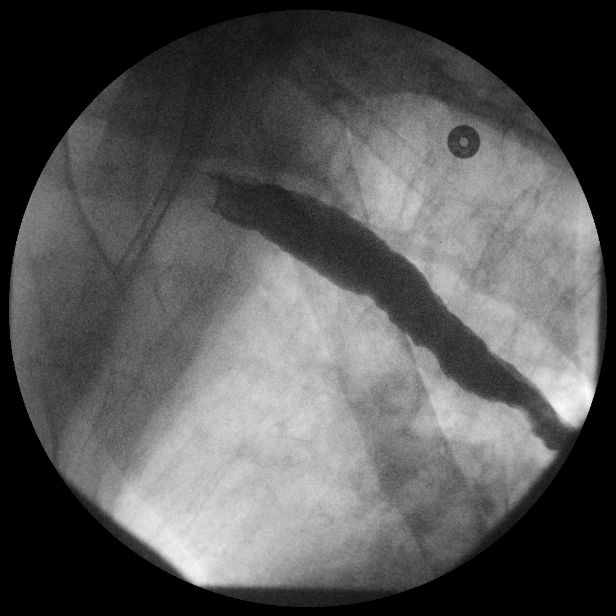

[Series 12: run · 1 of 1 slices shown (7 of 14)]
[im 1/1]
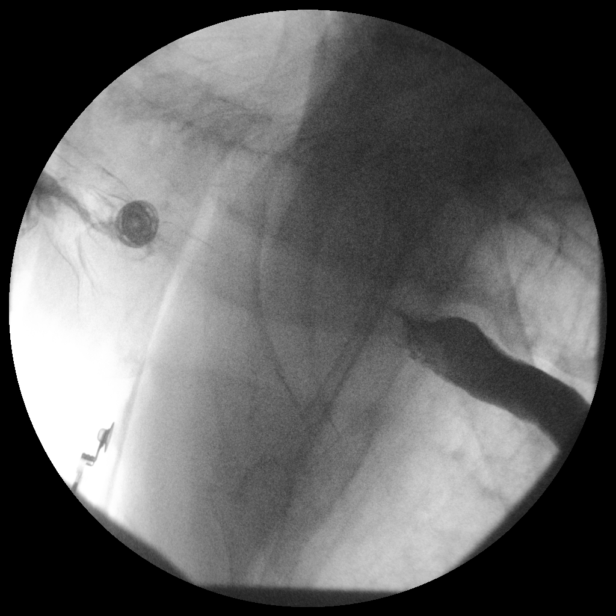

[Series 13: run · 1 of 1 slices shown (8 of 14)]
[im 1/1]
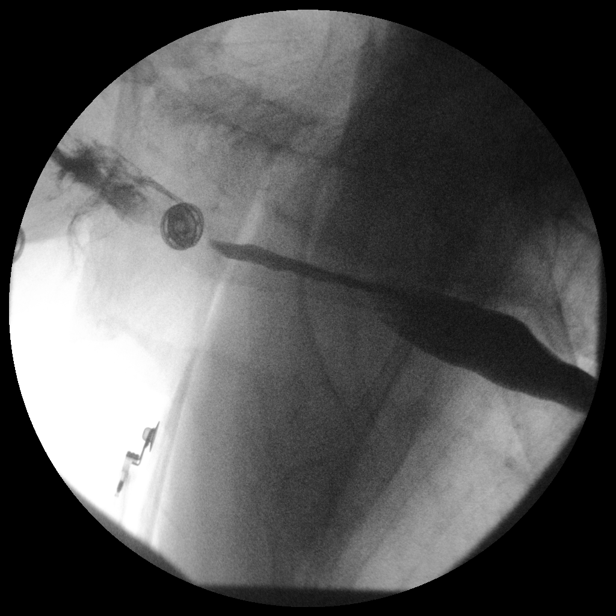

[Series 15: run · 1 of 1 slices shown (9 of 14)]
[im 1/1]
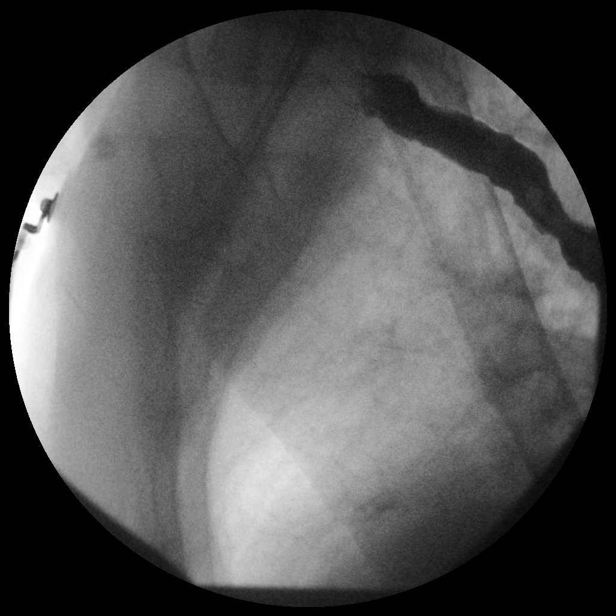

[Series 17: run · 1 of 1 slices shown (10 of 14)]
[im 1/1]
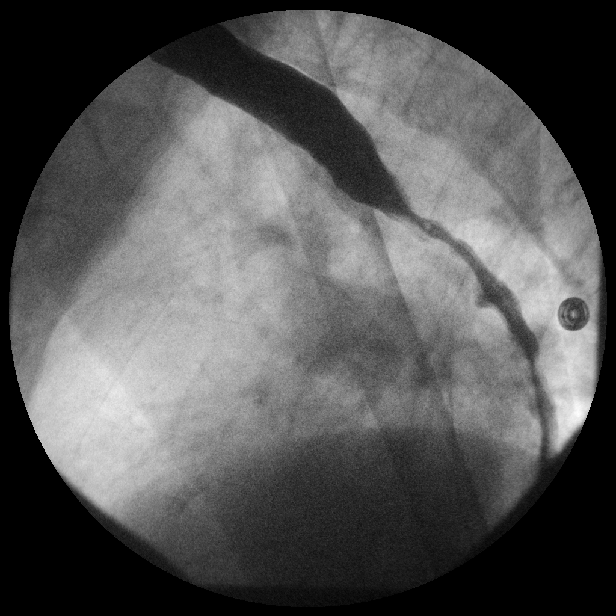

[Series 19: run · 1 of 1 slices shown (11 of 14)]
[im 1/1]
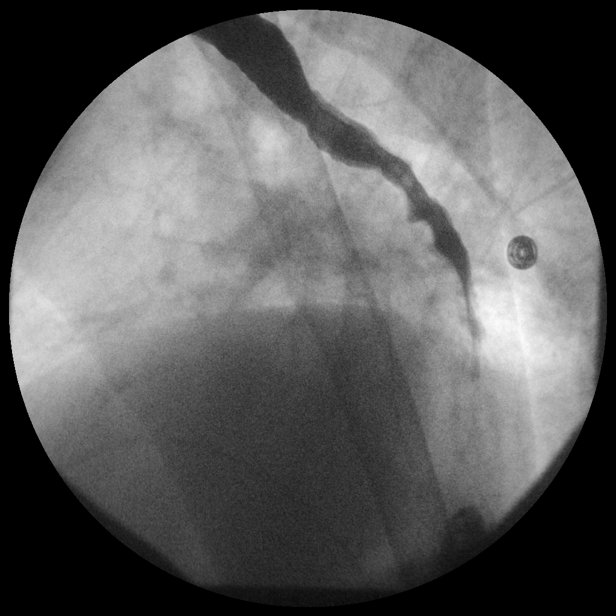

[Series 20: run · 1 of 1 slices shown (12 of 14)]
[im 1/1]
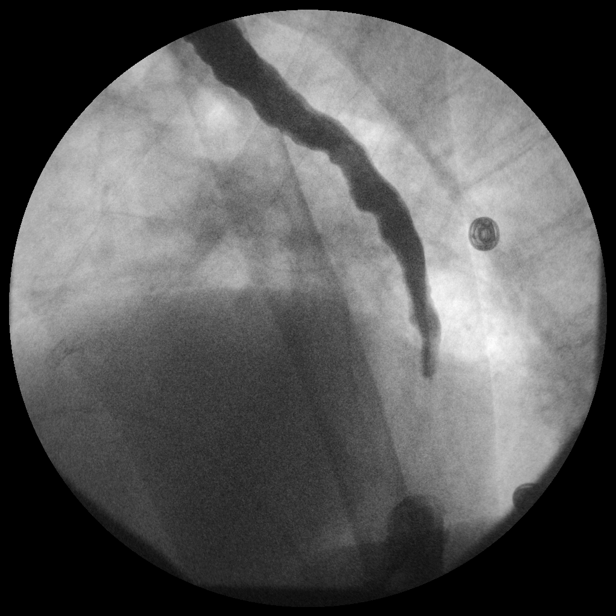

[Series 22: run · 1 of 1 slices shown (13 of 14)]
[im 1/1]
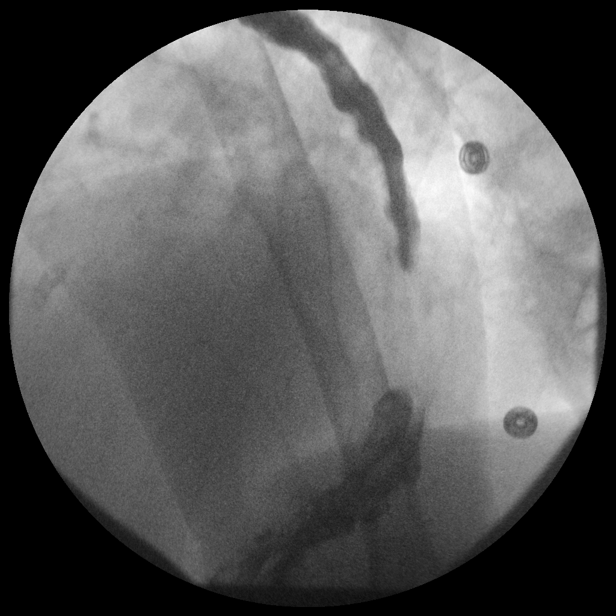

[Series 24: run · 1 of 1 slices shown (14 of 14)]
[im 1/1]
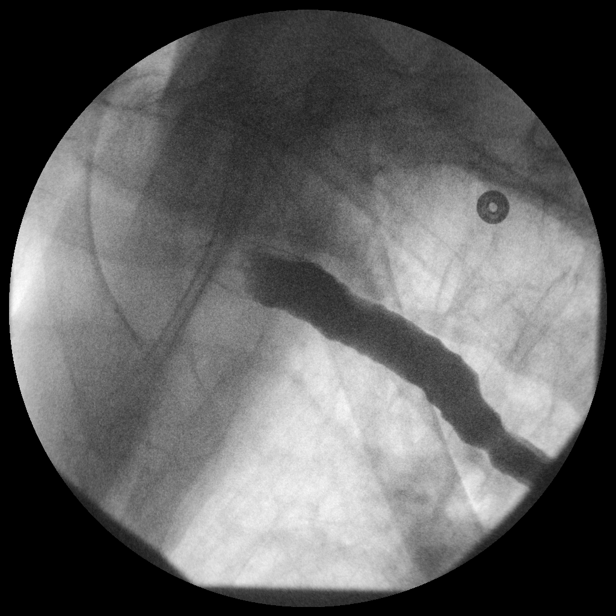

[14 of 24 positions shown; findings below may reference images not displayed]

FINDINGS: The patient was immobile. As such, this study was performed with
hand in a right-side-down decubitus position. B was given sips of
thin barium which passed readily through the esophagus into the
stomach. No gross or marked esophageal stricture or mass could be
identified. No evidence for esophageal diverticulum. A small hiatal
hernia is evident.

The patient was given a 13 mm barium tablet, but was unable to
swallow the tablet.
IMPRESSION: Grossly unremarkable limited study of esophagus with free passage of
barium from the mouth to the stomach. There is no esophageal
obstruction. Subtle stricture could be missed because the patient
was unable to swallow a barium tablet.

Small hiatal hernia.
